# Patient Record
Sex: Male | Born: 1950 | Race: White | Hispanic: No | Marital: Married | Smoking: Former smoker
Health system: Southern US, Community
[De-identification: ages and names within clinical notes are randomized; demographics above are authoritative.]

---

## 2004-05-20 ENCOUNTER — Ambulatory Visit: Payer: Self-pay | Admitting: General Surgery

## 2005-08-17 ENCOUNTER — Other Ambulatory Visit: Payer: Self-pay

## 2005-08-26 ENCOUNTER — Ambulatory Visit: Payer: Self-pay | Admitting: Specialist

## 2007-08-23 IMAGING — CR DG CHEST 2V
1 series · 2 of 2 positions shown · non-contrast
Comparison: none

REASON FOR EXAM: Asthma
COMMENTS:

PROCEDURE:     DXR - DXR CHEST PA (OR AP) AND LATERAL  - August 17, 2005 [DATE]
RESULT:       The lung fields are clear.   The heart and pulmonary
vasculature are normal in appearance.  No acute bony abnormalities are seen.

[Series 4167: postero_anterior · 0.22mm/px · 2 of 2 slices shown]
[im 1/2]
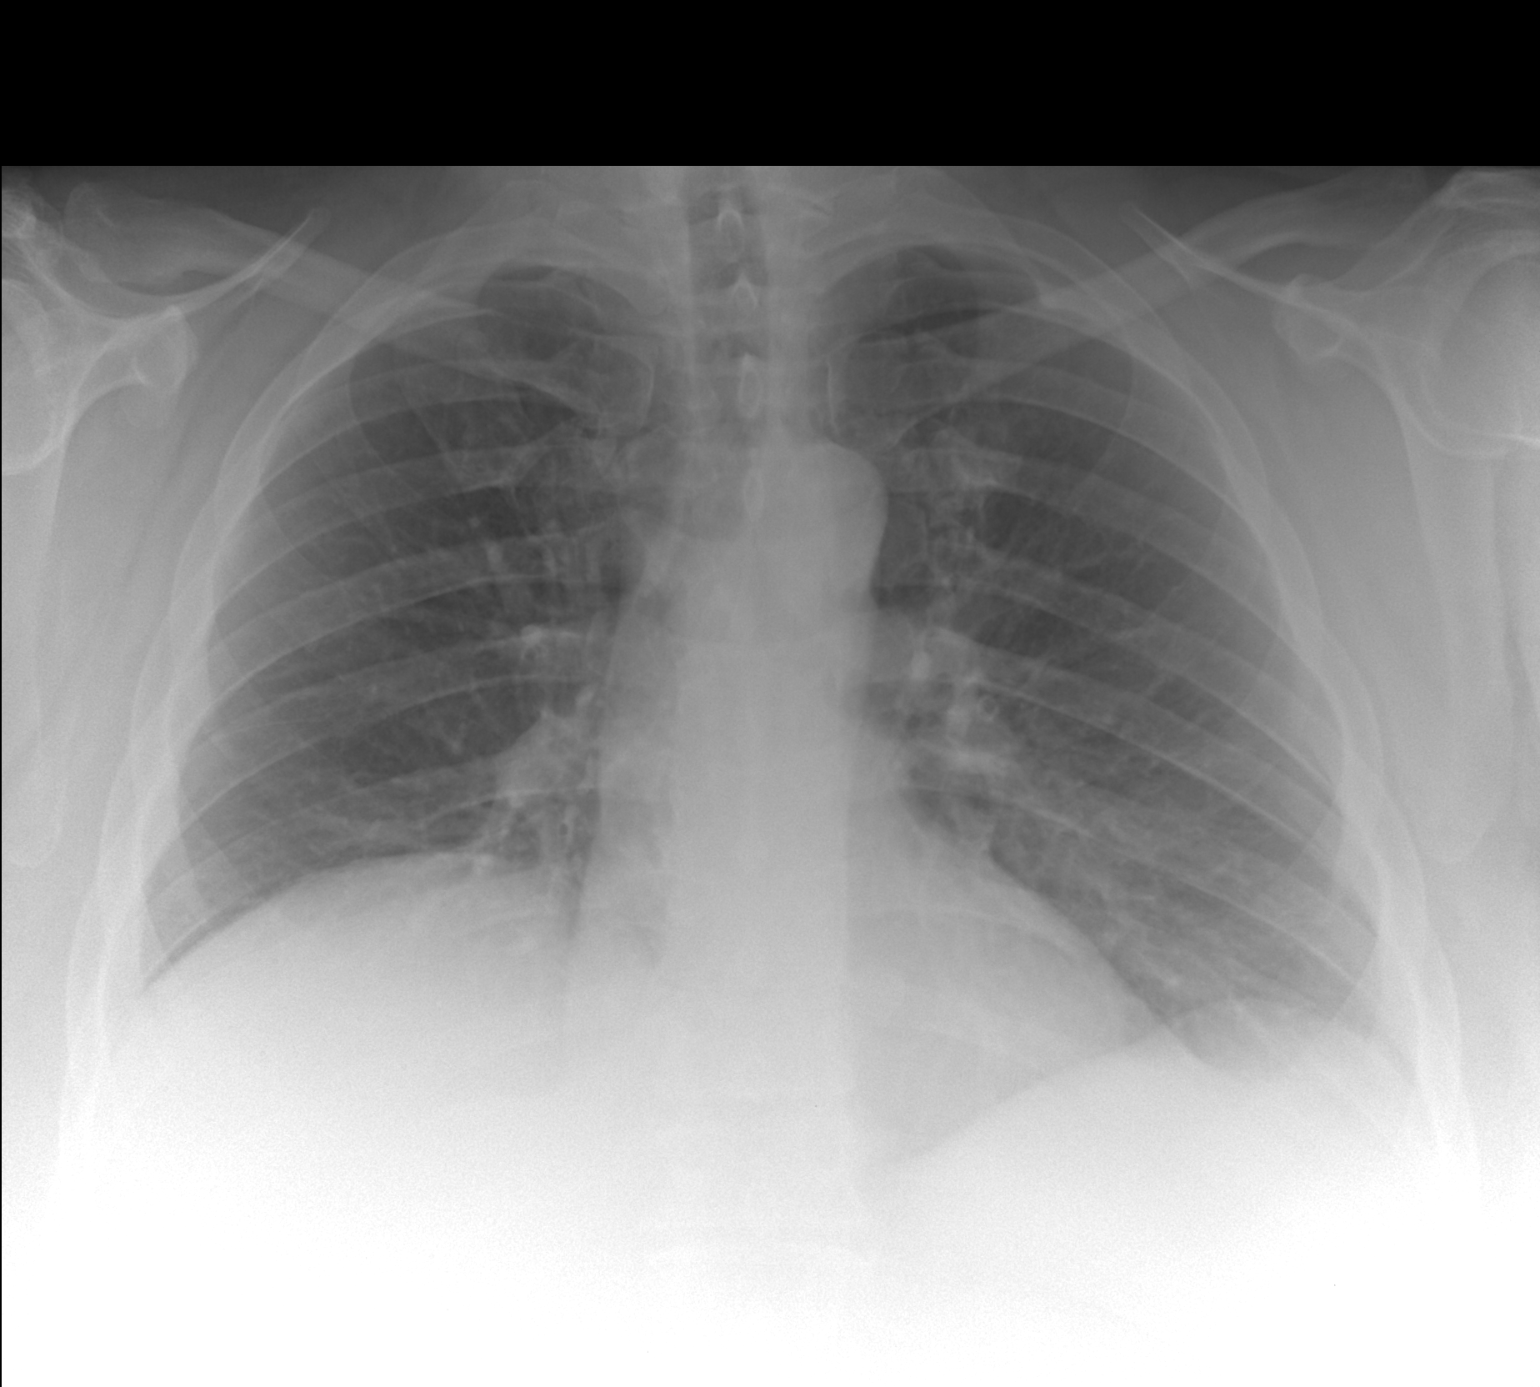
[im 2/2]
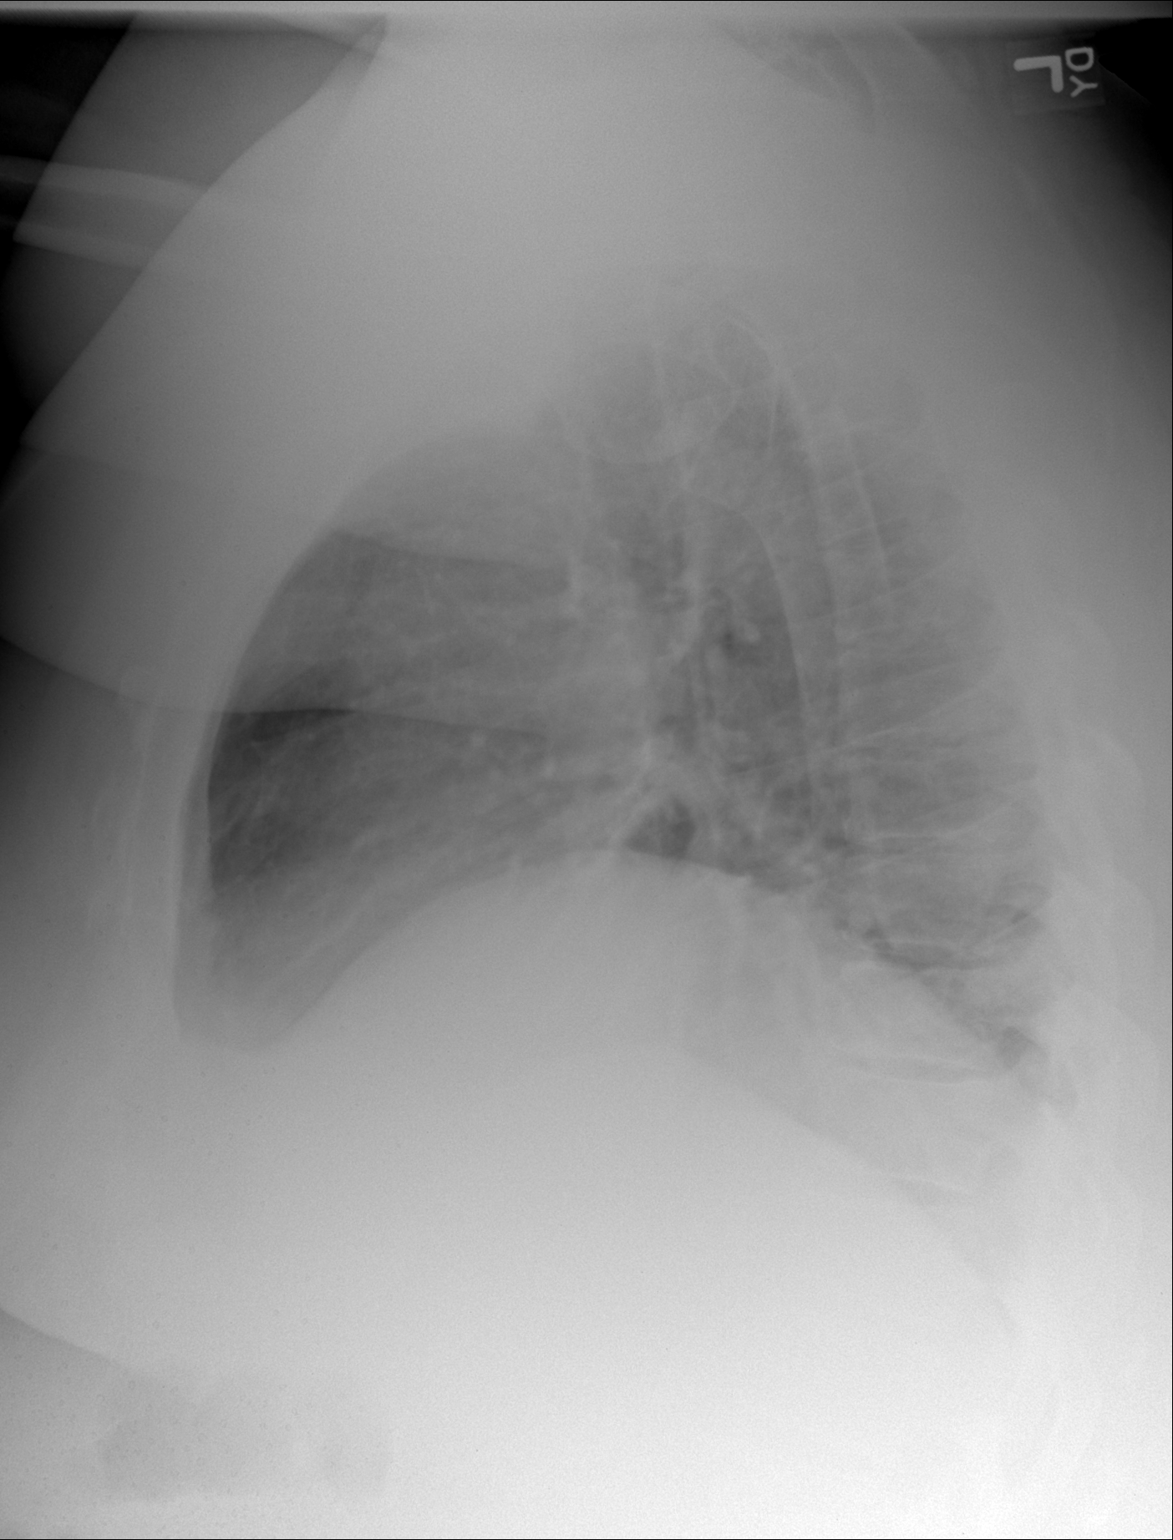

[2 of 2 positions shown; findings below may reference images not displayed]

IMPRESSION: No acute changes are identified.

## 2015-03-14 DIAGNOSIS — I890 Lymphedema, not elsewhere classified: Secondary | ICD-10-CM | POA: Insufficient documentation

## 2015-03-14 DIAGNOSIS — M1710 Unilateral primary osteoarthritis, unspecified knee: Secondary | ICD-10-CM | POA: Insufficient documentation

## 2015-03-14 DIAGNOSIS — I878 Other specified disorders of veins: Secondary | ICD-10-CM | POA: Insufficient documentation

## 2015-03-14 DIAGNOSIS — L97919 Non-pressure chronic ulcer of unspecified part of right lower leg with unspecified severity: Secondary | ICD-10-CM | POA: Insufficient documentation

## 2017-05-19 DIAGNOSIS — R600 Localized edema: Secondary | ICD-10-CM | POA: Insufficient documentation

## 2017-05-19 DIAGNOSIS — Q820 Hereditary lymphedema: Secondary | ICD-10-CM | POA: Insufficient documentation

## 2020-03-21 ENCOUNTER — Other Ambulatory Visit: Payer: Self-pay

## 2020-03-21 ENCOUNTER — Ambulatory Visit (INDEPENDENT_AMBULATORY_CARE_PROVIDER_SITE_OTHER): Payer: Medicare Other | Admitting: Vascular Surgery

## 2020-03-21 ENCOUNTER — Encounter (INDEPENDENT_AMBULATORY_CARE_PROVIDER_SITE_OTHER): Payer: Self-pay | Admitting: Vascular Surgery

## 2020-03-21 VITALS — BP 148/79 | HR 66 | Ht 69.0 in | Wt 356.0 lb

## 2020-03-21 DIAGNOSIS — L97919 Non-pressure chronic ulcer of unspecified part of right lower leg with unspecified severity: Secondary | ICD-10-CM | POA: Diagnosis not present

## 2020-03-21 DIAGNOSIS — I890 Lymphedema, not elsewhere classified: Secondary | ICD-10-CM

## 2020-03-21 DIAGNOSIS — I872 Venous insufficiency (chronic) (peripheral): Secondary | ICD-10-CM | POA: Insufficient documentation

## 2020-03-21 DIAGNOSIS — I83019 Varicose veins of right lower extremity with ulcer of unspecified site: Secondary | ICD-10-CM | POA: Diagnosis not present

## 2020-03-21 NOTE — Progress Notes (Signed)
MRN : [id number]  [name] is a 69 y.o. (1950/09/03) male who presents with chief complaint of  Chief Complaint  Patient presents with  . New Patient (Initial Visit)    Miller lyemphedema  .  History of Present Illness:   Patient is seen for evaluation of leg pain and swelling associated with ulceration of the right leg posteriorly. The patient first noticed the lymphedema and venous issues decades ago. The swelling is associated with pain and discoloration. The pain and swelling worsens with prolonged dependency and improves with elevation. The pain is unrelated to activity.  The patient notes that in the morning the legs are better but the leg symptoms worsened throughout the course of the day. The patient has also noted a progressive worsening of the discoloration in the ankle and shin area.   The patient notes that he has had an ulcer on and off for years.  There is a moderate amount of drainage associated with the open area.    The patient denies claudication symptoms or rest pain symptoms.  The patient denies DJD and LS spine disease.  The patient has not had any past angiography, interventions or vascular surgery.  Elevation makes the leg symptoms better, dependency makes them much worse. The patient denies any recent changes in medications.  The patient has been wearing graduated compression for a long time but he states his legs got so bad they no longer fit and so now his wife is wrapping his legs daily with what sounds like a modified Unna boot.  He also states he has had a lymph pump for perhaps 7 years but is no longer using it because the sleeves do not stay on particularly around the knee when he turns the machine on.  The patient denies a history of DVT or PE. There is no prior history of phlebitis. There is no history of primary lymphedema.  No history of malignancies. No history of trauma or groin or pelvic surgery. There is no history of radiation treatment  to the groin or pelvis       Current Meds  Medication Sig  . arformoterol (BROVANA) 15 MCG/2ML NEBU Inhale into the lungs.  Marland Kitchen aspirin 81 MG EC tablet Take by mouth.  . Calcium 500 MG tablet Take by mouth.  . Cholecalciferol (VITAMIN D3) 1.25 MG (50000 UT) CAPS Take by mouth.  . Cobalamin Combinations (B-12) [phone] MCG SUBL Place under the tongue.  Marland Kitchen ibuprofen (ADVIL) 800 MG tablet   . Multiple Vitamins-Minerals (MULTIVITAMIN ADULT, MINERALS, PO) Take 1 tablet by mouth daily.  Marland Kitchen omeprazole (PRILOSEC) 20 MG capsule Take by mouth.  . STIOLTO RESPIMAT 2.5-2.5 MCG/ACT AERS   . TAMSULOSIN HCL PO Take by mouth.    No past medical history on file.    Social History Social History   Tobacco Use  . Smoking status: Former Smoker    Quit date: 03/22/1987    Years since quitting: 33.0  . Smokeless tobacco: Never Used  Substance Use Topics  . Alcohol use: Not on file  . Drug use: Not on file    Family History No family history of bleeding/clotting disorders, porphyria or autoimmune disease   Allergies  Allergen Reactions  . Celecoxib Other (See Comments)    Made him mean and nasty      REVIEW OF SYSTEMS (Negative unless checked)  Constitutional: [] Weight loss  [] Fever  [] Chills Cardiac: [] Chest pain   [] Chest pressure   [] Palpitations   [] Shortness  of breath when laying flat   [] Shortness of breath with exertion. Vascular:  [] Pain in legs with walking   [x] Pain in legs at rest  [] History of DVT   [] Phlebitis   [x] Swelling in legs   [] Varicose veins   [x] Non-healing ulcers Pulmonary:   [] Uses home oxygen   [] Productive cough   [] Hemoptysis   [] Wheeze  [] COPD   [] Asthma Neurologic:  [] Dizziness   [] Seizures   [] History of stroke   [] History of TIA  [] Aphasia   [] Vissual changes   [] Weakness or numbness in arm   [] Weakness or numbness in leg Musculoskeletal:   [] Joint swelling   [] Joint pain   [] Low back pain Hematologic:  [] Easy bruising  [] Easy bleeding   [] Hypercoagulable  state   [] Anemic Gastrointestinal:  [] Diarrhea   [] Vomiting  [] Gastroesophageal reflux/heartburn   [] Difficulty swallowing. Genitourinary:  [] Chronic kidney disease   [] Difficult urination  [] Frequent urination   [] Blood in urine Skin:  [x] Rashes   [x] Ulcers  Psychological:  [] History of anxiety   []  History of major depression.  Physical Examination  Vitals:   03/21/20 1027  BP: (!) 148/79  Pulse: 66  Weight: (!) 356 lb (161.5 kg)  Height: 5\' 9"  (1.753 m)   Body mass index is 52.57 kg/m. Gen: WD/WN, NAD Head: [place]/AT, No temporalis wasting.  Ear/Nose/Throat: Hearing grossly intact, nares w/o erythema or drainage, poor dentition Eyes: PER, EOMI, sclera nonicteric.  Neck: Supple, no masses.  No bruit or JVD.  Pulmonary:  Good air movement, clear to auscultation bilaterally, no use of accessory muscles.  Cardiac: RRR, normal S1, S2, no Murmurs. Vascular: scattered varicosities present bilaterally.  Profound venous stasis changes to the legs bilaterally.  4+ massive hard non-pitting edema.  Ulcer posterior calf draining yellow lymph fluid Vessel Right Left  Radial Palpable Palpable  Gastrointestinal: soft, non-distended. No guarding/no peritoneal signs.  Musculoskeletal: M/S 5/5 throughout.  No deformity or atrophy.  Neurologic: CN 2-12 intact. Pain and light touch intact in extremities.  Symmetrical.  Speech is fluent. Motor exam as listed above. Psychiatric: Judgment intact, Mood & affect appropriate for pt's clinical situation. Dermatologic: Profound venous stasis changes to the legs bilaterally.  4+ massive hard non-pitting edema.  Ulcer posterior calf draining yellow lymph fluid.  No changes consistent with cellulitis. Lymph : + lichenification and skin changes of chronic lymphedema.  CBC No results found for: WBC, HGB, HCT, MCV, PLT  BMET No results found for: NA, K, CL, CO2, GLUCOSE, BUN, CREATININE, CALCIUM, GFRNONAA, GFRAA CrCl cannot be calculated (No successful lab value  found.).  COAG No results found for: INR, PROTIME  Radiology No results found.    Assessment/Plan 1. Venous stasis ulcer of right lower extremity (HCC) No surgery or intervention at this point in time.    I have had a long discussion with the patient regarding venous insufficiency and why it  causes symptoms. I have discussed with the patient the chronic skin changes that accompany venous insufficiency and the long term sequela such as infection and ulceration.  Patient will begin wearing graduated compression  wraps on a daily basis a prescription was given. The patient will put the stockings on first thing in the morning and removing them in the evening. The patient is instructed specifically not to sleep in the wraps.    In addition, behavioral modification including several periods of elevation of the lower extremities during the day will be continued. I have demonstrated that proper elevation is a position with the ankles at  heart level.  The patient is instructed to begin routine exercise, especially walking on a daily basis  Also it sounds like his pump is old and not functioning properly and we will work toward getting him a new updated pump that is properly fitted   2. Chronic venous insufficiency See #1  3. Lymphedema of both lower extremities Recommend:  No surgery or intervention at this point in time.    I have reviewed my previous discussion with the patient regarding swelling and why it causes symptoms.  Patient will continue wearing graduated compression stockings class 1 (20-30 mmHg) on a daily basis. The patient will  beginning wearing the stockings first thing in the morning and removing them in the evening. The patient is instructed specifically not to sleep in the stockings.    In addition, behavioral modification including several periods of elevation of the lower extremities during the day will be continued.  This was reviewed with the patient during the  initial visit.  The patient will also continue routine exercise, especially walking on a daily basis as was discussed during the initial visit.    Despite conservative treatments including graduated compression therapy class 1 and behavioral modification including exercise and elevation the patient  has not obtained adequate control of the lymphedema.  The patient still has stage 3 lymphedema and therefore, I believe that a lymph pump should be added to improve the control of the patient's lymphedema.  Additionally, a lymph pump is warranted because it will reduce the risk of cellulitis and ulceration in the future.  Patient should follow-up in six months      Levora Dredge, MD  03/21/2020 11:12 AM

## 2020-09-19 ENCOUNTER — Ambulatory Visit (INDEPENDENT_AMBULATORY_CARE_PROVIDER_SITE_OTHER): Payer: Medicare Other | Admitting: Nurse Practitioner

## 2021-11-14 ENCOUNTER — Encounter: Payer: Medicare Other | Attending: Physician Assistant | Admitting: Physician Assistant

## 2021-11-14 DIAGNOSIS — I87331 Chronic venous hypertension (idiopathic) with ulcer and inflammation of right lower extremity: Secondary | ICD-10-CM | POA: Insufficient documentation

## 2021-11-14 DIAGNOSIS — J449 Chronic obstructive pulmonary disease, unspecified: Secondary | ICD-10-CM | POA: Insufficient documentation

## 2021-11-14 DIAGNOSIS — L97812 Non-pressure chronic ulcer of other part of right lower leg with fat layer exposed: Secondary | ICD-10-CM | POA: Insufficient documentation

## 2021-11-14 DIAGNOSIS — I10 Essential (primary) hypertension: Secondary | ICD-10-CM | POA: Insufficient documentation

## 2021-11-14 DIAGNOSIS — I890 Lymphedema, not elsewhere classified: Secondary | ICD-10-CM | POA: Insufficient documentation

## 2021-11-14 NOTE — Progress Notes (Signed)
EUGENIO, DOLLINS ([id number]) Visit Report for 11/14/2021 Allergy List Details Patient Name: [name], [name]. Date of Service: 11/14/2021 8:45 AM Medical Record Number: [id number] Patient Account Number: [id number] Date of Birth/Sex: 17-Apr-1951 (71 y.o. M) Treating RN: Carlene Coria Primary Care Kimanh Templeman: Carley Hammed Other Clinician: Referring Justyce Yeater: Referral, Self Treating Laquia Rosano/Extender: Skipper Cliche in Treatment: 0 Allergies Active Allergies Sulfa (Sulfonamide Antibiotics) Celebrex Septra Allergy Notes Electronic Signature(s) Signed: 11/14/2021 4:26:56 PM By: Carlene Coria RN Entered By: Carlene Coria on 11/14/2021 08:49:27 Wholey, Elliot Dally ([id number]) -------------------------------------------------------------------------------- [place] Details Patient Name: [name] Date of Service: 11/14/2021 8:45 AM Medical Record Number: [id number] Patient Account Number: [id number] Date of Birth/Sex: 05/03/1951 (71 y.o. M) Treating RN: Carlene Coria Primary Care Emilea Goga: Carley Hammed Other Clinician: Referring Hardin Hardenbrook: Referral, Self Treating Allisson Schindel/Extender: Skipper Cliche in Treatment: 0 Visit Information Patient Arrived: Ambulatory Arrival Time: 08:47 Accompanied By: self Transfer Assistance: None Patient Identification Verified: Yes Secondary Verification Process Completed: Yes Patient Requires Transmission-Based Precautions: No Patient Has Alerts: No Electronic Signature(s) Signed: 11/14/2021 4:26:56 PM By: Carlene Coria RN Entered By: Carlene Coria on 11/14/2021 08:47:47 Huston, Elliot Dally ([id number]) -------------------------------------------------------------------------------- Clinic Level of Care Assessment Details Patient Name: [name] Date of Service: 11/14/2021 8:45 AM Medical Record Number: [id number] Patient Account Number: [id number] Date of Birth/Sex: 07-31-1950 (71 y.o. M) Treating RN: Carlene Coria Primary Care Deaveon Schoen: Carley Hammed Other Clinician: Referring Jessalynn Mccowan: Referral, Self Treating Kialee Kham/Extender: Skipper Cliche in Treatment: 0 Clinic Level of Care Assessment Items TOOL 1 Quantity Score X - Use when EandM and Procedure is performed on INITIAL visit 1 0 ASSESSMENTS - Nursing Assessment / Reassessment X - General Physical Exam (combine w/ comprehensive assessment (listed just below) when performed on new 1 20 pt. evals) X- 1 25 Comprehensive Assessment (HX, ROS, Risk Assessments, Wounds Hx, etc.) ASSESSMENTS - Wound and Skin Assessment / Reassessment []  - Dermatologic / Skin Assessment (not related to wound area) 0 ASSESSMENTS - Ostomy and/or Continence Assessment and Care []  - Incontinence Assessment and Management 0 []  - 0 Ostomy Care Assessment and Management (repouching, etc.) PROCESS - Coordination of Care X - Simple Patient / Family Education for ongoing care 1 15 []  - 0 Complex (extensive) Patient / Family Education for ongoing care X- 1 10 Staff obtains Programmer, systems, Records, Test Results / Process Orders []  - 0 Staff telephones HHA, Nursing Homes / Clarify orders / etc []  - 0 Routine Transfer to another Facility (non-emergent condition) []  - 0 Routine Hospital Admission (non-emergent condition) X- 1 15 New Admissions / Biomedical engineer / Ordering NPWT, Apligraf, etc. []  - 0 Emergency Hospital Admission (emergent condition) PROCESS - Special Needs []  - Pediatric / Minor Patient Management 0 []  - 0 Isolation Patient Management []  - 0 Hearing / Language / Visual special needs []  - 0 Assessment of Community assistance (transportation, D/C planning, etc.) []  - 0 Additional assistance / Altered mentation []  - 0 Support Surface(s) Assessment (bed, cushion, seat, etc.) INTERVENTIONS - Miscellaneous []  - External ear exam 0 []  - 0 Patient Transfer (multiple staff / Civil Service fast streamer / Similar devices) []  - 0 Simple Staple / Suture  removal (25 or less) []  - 0 Complex Staple / Suture removal (26 or more) []  - 0 Hypo/Hyperglycemic Management (do not check if billed separately) X- 1 15 Ankle / Brachial Index (ABI) - do not check if billed separately Has the patient been seen at the hospital within the last three years: Yes Total Score: 100 Level  Of Care: New/Established - Level 3 DEWIE, AHART ([id number]) Electronic Signature(s) Signed: 11/14/2021 4:26:56 PM By: Carlene Coria RN Entered By: Carlene Coria on 11/14/2021 09:44:49 Fina, Elliot Dally ([id number]) -------------------------------------------------------------------------------- Compression Therapy Details Patient Name: [name] Date of Service: 11/14/2021 8:45 AM Medical Record Number: [id number] Patient Account Number: [id number] Date of Birth/Sex: September 10, 1950 (72 y.o. M) Treating RN: Carlene Coria Primary Care Karita Dralle: Carley Hammed Other Clinician: Referring Winslow Verrill: Referral, Self Treating Ramesh Moan/Extender: Skipper Cliche in Treatment: 0 Compression Therapy Performed for Wound Assessment: Wound #1 Right Lower Leg Performed By: Clinician Carlene Coria, RN Compression Type: Three Layer Post Procedure Diagnosis Same as Pre-procedure Electronic Signature(s) Signed: 11/14/2021 4:26:56 PM By: Carlene Coria RN Entered By: Carlene Coria on 11/14/2021 09:44:06 Vonstein, Elliot Dally ([id number]) -------------------------------------------------------------------------------- Lower Extremity Assessment Details Patient Name: [name] Date of Service: 11/14/2021 8:45 AM Medical Record Number: [id number] Patient Account Number: [id number] Date of Birth/Sex: 04/01/1951 (71 y.o. M) Treating RN: Carlene Coria Primary Care Samah Lapiana: Carley Hammed Other Clinician: Referring Bowie Doiron: Referral, Self Treating Issabela Lesko/Extender: Skipper Cliche in Treatment: 0 Edema Assessment Assessed: [Left: No] [Right: No] Edema: [Left: Ye]  [Right: s] Calf Left: Right: Point of Measurement: 37 cm From Medial Instep 59 cm Ankle Left: Right: Point of Measurement: 13 cm From Medial Instep 35 cm Knee To Floor Left: Right: From Medial Instep 50 cm Vascular Assessment Pulses: Dorsalis Pedis Palpable: [Right:Yes] Doppler Audible: [Right:Yes] Blood Pressure: Brachial: [Right:130] Ankle: [Right:Dorsalis Pedis: 160 1.23] Electronic Signature(s) Signed: 11/14/2021 4:26:56 PM By: Carlene Coria RN Entered By: Carlene Coria on 11/14/2021 09:19:22 Ransier, Elliot Dally ([id number]) -------------------------------------------------------------------------------- Multi Wound Chart Details Patient Name: [name] Date of Service: 11/14/2021 8:45 AM Medical Record Number: [id number] Patient Account Number: [id number] Date of Birth/Sex: 20-Oct-1950 (71 y.o. M) Treating RN: Carlene Coria Primary Care Keara Pagliarulo: Carley Hammed Other Clinician: Referring Demetrious Rainford: Referral, Self Treating [place] Decoteau/Extender: Skipper Cliche in Treatment: 0 Vital Signs Height(in): 68 Pulse(bpm): 71 Weight(lbs): 340 Blood Pressure(mmHg): 130/85 Body Mass Index(BMI): 51.7 Temperature(F): 98 Respiratory Rate(breaths/min): 18 Photos: [1:No Photos] [N/A:N/A] Wound Location: [1:Right Lower Leg] [N/A:N/A] Wounding Event: [1:Bite] [N/A:N/A] Primary Etiology: [1:Venous Leg Ulcer] [N/A:N/A] Comorbid History: [1:Asthma, Chronic Obstructive Pulmonary Disease (COPD), Hypertension] [N/A:N/A] Date Acquired: [1:12/15/1991] [N/A:N/A] Weeks of Treatment: [1:0] [N/A:N/A] Wound Status: [1:Open] [N/A:N/A] Wound Recurrence: [1:No] [N/A:N/A] Measurements L x W x D (cm) [1:5x3.5x0.1] [N/A:N/A] Area (cm) : [1:13.744] [N/A:N/A] Volume (cm) : [1:1.374] [N/A:N/A] Classification: [1:Full Thickness Without Exposed Support Structures] [N/A:N/A] Exudate Amount: [1:Medium] [N/A:N/A] Exudate Type: [1:Serosanguineous] [N/A:N/A] Exudate Color: [1:red, brown]  [N/A:N/A] Granulation Amount: [1:Medium (34-66%)] [N/A:N/A] Granulation Quality: [1:Red] [N/A:N/A] Necrotic Amount: [1:Medium (34-66%)] [N/A:N/A] Exposed Structures: [1:Fat Layer (Subcutaneous Tissue): Yes Fascia: No Tendon: No Muscle: No Joint: No Bone: No None] [N/A:N/A N/A] Treatment Notes Electronic Signature(s) Signed: 11/14/2021 4:26:56 PM By: Carlene Coria RN Entered By: Carlene Coria on 11/14/2021 09:39:37 Evinger, Elliot Dally ([id number]) -------------------------------------------------------------------------------- [place] Details Patient Name: [name] Date of Service: 11/14/2021 8:45 AM Medical Record Number: [id number] Patient Account Number: [id number] Date of Birth/Sex: 1950/06/18 (71 y.o. M) Treating RN: Carlene Coria Primary Care Collie Kittel: Carley Hammed Other Clinician: Referring Imad Shostak: Referral, Self Treating Jerene Yeager/Extender: Skipper Cliche in Treatment: 0 Active Inactive Wound/Skin Impairment Nursing Diagnoses: Knowledge deficit related to ulceration/compromised skin integrity Goals: Patient/caregiver will verbalize understanding of skin care regimen Date Initiated: 11/14/2021 Target Resolution Date: 12/14/2021 Goal Status: Active Ulcer/skin breakdown will have a volume reduction of 30% by week 4 Date Initiated: 11/14/2021 Target Resolution Date: 12/14/2021 Goal  Status: Active Ulcer/skin breakdown will have a volume reduction of 50% by week 8 Date Initiated: 11/14/2021 Target Resolution Date: 01/14/2022 Goal Status: Active Ulcer/skin breakdown will have a volume reduction of 80% by week 12 Date Initiated: 11/14/2021 Target Resolution Date: 02/14/2022 Goal Status: Active Ulcer/skin breakdown will heal within 14 weeks Date Initiated: 11/14/2021 Target Resolution Date: 03/16/2022 Goal Status: Active Interventions: Assess patient/caregiver ability to obtain necessary supplies Assess patient/caregiver ability to perform ulcer/skin care  regimen upon admission and as needed Assess ulceration(s) every visit Notes: Electronic Signature(s) Signed: 11/14/2021 4:26:56 PM By: Carlene Coria RN Entered By: Carlene Coria on 11/14/2021 09:39:15 Speigner, Elliot Dally ([id number]) -------------------------------------------------------------------------------- Pain Assessment Details Patient Name: [name] Date of Service: 11/14/2021 8:45 AM Medical Record Number: [id number] Patient Account Number: [id number] Date of Birth/Sex: 1950/10/18 (71 y.o. M) Treating RN: Carlene Coria Primary Care Jewelia Bocchino: Carley Hammed Other Clinician: Referring Jhonny Calixto: Referral, Self Treating Patrich Heinze/Extender: Skipper Cliche in Treatment: 0 Active Problems Location of Pain Severity and Description of Pain Patient Has Paino No Site Locations Pain Management and Medication Current Pain Management: Electronic Signature(s) Signed: 11/14/2021 4:26:56 PM By: Carlene Coria RN Entered By: Carlene Coria on 11/14/2021 08:48:05 Saenz, Elliot Dally ([id number]) -------------------------------------------------------------------------------- Patient/Caregiver Education Details Patient Name: [name] Date of Service: 11/14/2021 8:45 AM Medical Record Number: [id number] Patient Account Number: [id number] Date of Birth/Gender: 1950-10-03 (71 y.o. M) Treating RN: Carlene Coria Primary Care Physician: Carley Hammed Other Clinician: Referring Physician: Referral, Self Treating Physician/Extender: Skipper Cliche in Treatment: 0 Education Assessment Education Provided To: Patient Education Topics Provided Wound/Skin Impairment: Methods: Explain/Verbal Responses: State content correctly Electronic Signature(s) Signed: 11/14/2021 4:26:56 PM By: Carlene Coria RN Entered By: Carlene Coria on 11/14/2021 09:45:11 Trahan, Elliot Dally ([id number]) -------------------------------------------------------------------------------- Wound Assessment  Details Patient Name: [name] Date of Service: 11/14/2021 8:45 AM Medical Record Number: [id number] Patient Account Number: [id number] Date of Birth/Sex: 05-31-1951 (71 y.o. M) Treating RN: Carlene Coria Primary Care Rashun Grattan: Carley Hammed Other Clinician: Referring Galo Sayed: Referral, Self Treating Terrill Alperin/Extender: Skipper Cliche in Treatment: 0 Wound Status Wound Number: 1 Primary Venous Leg Ulcer Etiology: Wound Location: Right Lower Leg Wound Status: Open Wounding Event: Bite Comorbid Asthma, Chronic Obstructive Pulmonary Disease Date Acquired: 12/15/1991 History: (COPD), Hypertension Weeks Of Treatment: 0 Clustered Wound: No Wound Measurements Length: (cm) 5 Width: (cm) 3.5 Depth: (cm) 0.1 Area: (cm) 13.744 Volume: (cm) 1.374 % Reduction in Area: % Reduction in Volume: Epithelialization: None Tunneling: No Undermining: No Wound Description Classification: Full Thickness Without Exposed Support Structu Exudate Amount: Medium Exudate Type: Serosanguineous Exudate Color: red, brown res Foul Odor After Cleansing: No Slough/Fibrino Yes Wound Bed Granulation Amount: Medium (34-66%) Exposed Structure Granulation Quality: Red Fascia Exposed: No Necrotic Amount: Medium (34-66%) Fat Layer (Subcutaneous Tissue) Exposed: Yes Necrotic Quality: Adherent Slough Tendon Exposed: No Muscle Exposed: No Joint Exposed: No Bone Exposed: No Treatment Notes Wound #1 (Lower Leg) Wound Laterality: Right Cleanser [place] Ancillary Kit - 15 Day Supply Discharge Instruction: Use supplies as instructed; Kit contains: (15) Saline Bullets; (15) 3x3 Gauze; 15 pr Gloves Soap and Water Discharge Instruction: Gently cleanse wound with antibacterial soap, rinse and pat dry prior to dressing wounds Peri-Wound Care Topical Primary Dressing Silvercel Small 2x2 (in/in) Discharge Instruction: Apply Silvercel Small 2x2 (in/in) as instructed Secondary Dressing ABD Pad 5x9  (in/in) Discharge Instruction: Cover with ABD pad Gauze Discharge Instruction: As directed: dry, moistened with saline or moistened with Dakins Solution Altic, Elliot Dally ([id number]) Secured With Compression Wrap 3-LAYER WRAP -  Profore Lite LF 3 Multilayer Compression Bandaging System Discharge Instruction: Apply 3 multi-layer wrap as prescribed. Compression Stockings Add-Ons Electronic Signature(s) Signed: 11/14/2021 4:26:56 PM By: Carlene Coria RN Entered By: Carlene Coria on 11/14/2021 09:21:59 [place], Elliot Dally ([id number]) -------------------------------------------------------------------------------- [place] Details Patient Name: [name] Date of Service: 11/14/2021 8:45 AM Medical Record Number: [id number] Patient Account Number: [id number] Date of Birth/Sex: Oct 08, 1950 (71 y.o. M) Treating RN: Carlene Coria Primary Care Rodgerick Gilliand: Carley Hammed Other Clinician: Referring Alan Drummer: Referral, Self Treating Tyrique Sporn/Extender: Skipper Cliche in Treatment: 0 Vital Signs Time Taken: 08:48 Temperature (F): 98 Height (in): 68 Pulse (bpm): 71 Source: Stated Respiratory Rate (breaths/min): 18 Weight (lbs): 340 Blood Pressure (mmHg): 130/85 Source: Stated Reference Range: 80 - 120 mg / dl Body Mass Index (BMI): 51.7 Electronic Signature(s) Signed: 11/14/2021 4:26:56 PM By: Carlene Coria RN Entered By: Carlene Coria on 11/14/2021 08:48:30

## 2021-11-14 NOTE — Progress Notes (Signed)
OBRIEN, HUSKINS ([id number]) Visit Report for 11/14/2021 Chief Complaint Document Details Patient Name: [name], [name]. Date of Service: 11/14/2021 8:45 AM Medical Record Number: [id number] Patient Account Number: [id number] Date of Birth/Sex: Oct 26, 1950 (71 y.o. 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 M) Treating RN: Carlene Coria Primary Care Provider: Carley Hammed Other Clinician: Referring Provider: Referral, Self Treating Provider/Extender: Skipper Cliche in Treatment: 0 Debridement Performed for Wound #1 Right Lower Leg Assessment: Performed By: Physician Tommie Sams., PA-C Debridement Type: Debridement Severity of Tissue Pre Debridement: Fat layer exposed Level of Consciousness (Pre- Awake and Alert procedure): Pre-procedure Verification/Time Out Yes - 09:47 Taken: Start Time: 09:47 Total Area Debrided (L x W): 5 (cm) x 3.5 (cm) = 17.5 (cm) Tissue and other material Viable, Non-Viable, Slough, Subcutaneous, Skin: Dermis , Skin: Epidermis, Biofilm, Slough debrided: Level: Skin/Subcutaneous Tissue Debridement Description: Excisional Instrument: Curette Bleeding: Moderate Hemostasis Achieved: Pressure End Time: 09:51 Procedural Pain: 0 Post Procedural Pain: 0 Response to Treatment:  Procedure was tolerated well Level of Consciousness (Post- Awake and Alert procedure): Post Debridement Measurements of Total Wound Length: (cm) 5 Width: (cm) 3.5 Depth: (cm) 0.3 Volume: (cm) 4.123 Character of Wound/Ulcer Post Debridement: Improved Severity of Tissue Post Debridement: Fat layer exposed Post Procedure Diagnosis Same as Pre-procedure Electronic Signature(s) Signed: 11/14/2021 4:26:56 PM By: Carlene Coria RN Signed: 11/14/2021 4:36:57 PM By: Worthy Keeler PA-C Entered By: Carlene Coria on 11/14/2021 09:49:51 [place], Elliot Dally ([id number]) -------------------------------------------------------------------------------- HPI Details Patient Name: [name] Date of Service: 11/14/2021 8:45 AM Medical Record Number: [id number] Patient Account Number: [id number] Date of Birth/Sex: 1950-07-25 (71 y.o. M) Treating RN: Carlene Coria Primary Care Provider: Carley Hammed Other Clinician: Referring Provider: Referral, Self Treating Provider/Extender: Skipper Cliche in Treatment: 0 History of Present Illness HPI Description: 11-14-2021 upon evaluation today patient presents for initial evaluation here in our clinic concerning issues he is having with his right lower extremity he does have a wound on the posterior aspect. This has been present intermittently honestly since 1993. 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 There is a lot of scar tissue which I think is probably the reason why this reopens specifically in regard to the fact that he does have chronic venous insufficiency and lymphedema in his extremities. This means anytime his legs swell this is good to be the most likely area to weep. Right now its been about 3 weeks they have been trying to get this closed at this time and to be honest generally it closes much more quickly than  that. That is the reason he has been seen today to see if we can get this closed. Has been on 3 rounds of antibiotics without benefit. On 10-01-2021 the patient did have stents placed bilaterally in the lower extremities and ablation on the left side done. This is outside of our visible EMR system I was  not able to confirm this independently but his arterial flow and ABIs in the clinic today was 1.33 on the right. He also was started on Augmentin on May 15 for 10-day course. That is done at this point. Otherwise he has a history of hypertension and COPD. Electronic Signature(s) Signed: 11/14/2021 1:26:01 PM By: Worthy Keeler PA-C Entered By: Worthy Keeler on 11/14/2021 13:26:01 [place], Elliot Dally ([id number]) -------------------------------------------------------------------------------- Physical Exam Details Patient Name: [name] Date of Service: 11/14/2021 8:45 AM Medical Record Number: [id number] Patient Account Number: [id number] Date of Birth/Sex: 02/18/1951 (71 y.o. M) Treating RN: Carlene Coria Primary Care Provider: Carley Hammed Other Clinician: Referring Provider: Referral, Self Treating Provider/Extender: Skipper Cliche in Treatment: 0 Constitutional sitting or standing blood pressure is within target range for patient.. pulse regular and within target range for patient.Marland Kitchen respirations regular, non- labored and within target range for patient.Marland Kitchen temperature within target range for patient.. Obese and well-hydrated in no acute distress. Eyes conjunctiva clear no eyelid edema noted. pupils equal round and reactive to light and accommodation. Ears, Nose, Mouth, and Throat no gross abnormality of ear auricles or external auditory canals. normal hearing noted during conversation. mucus membranes moist. Respiratory normal breathing without difficulty. Cardiovascular 2+ dorsalis pedis/posterior tibialis pulses. no clubbing, cyanosis, significant edema, <3 sec cap  refill. Musculoskeletal normal gait and posture. no significant deformity or arthritic changes, no loss or range of motion, no clubbing. Psychiatric this patient is able to make decisions and demonstrates good insight into disease process. Alert and Oriented x 3. pleasant and cooperative. Notes Upon inspection patient's wound bed actually showed signs of good granulation and minimal areas he does have a lot of slough and biofilm buildup noted at this time. I do believe we can need to see what we can do about getting this under control. He voiced understanding. Subsequently we agreed to perform sharp debridement I did debride the wound today to clearway slough and biofilm there was a significant portion of this that I was able to remove and postdebridement the wound bed appears to be doing much better. Electronic Signature(s) Signed: 11/14/2021 1:32:03 PM By: Worthy Keeler PA-C Entered By: Worthy Keeler on 11/14/2021 13:32:03 [place], Elliot Dally ([id number]) -------------------------------------------------------------------------------- Physician Orders Details Patient Name: [name] Date of Service: 11/14/2021 8:45 AM Medical Record Number: [id number] Patient Account Number: [id number] Date of Birth/Sex: Jun 12, 1951 (71 y.o. M) Treating RN: Carlene Coria Primary Care Provider: Carley Hammed Other Clinician: Referring Provider: Referral, Self Treating Provider/Extender: Skipper Cliche in Treatment: 0 Verbal / Phone Orders: No Diagnosis Coding ICD-10 Coding Code Description I87.331 Chronic venous hypertension (idiopathic) with ulcer and inflammation of right lower extremity I89.0 Lymphedema, not elsewhere classified L97.812 Non-pressure chronic ulcer of other part of right lower leg with fat layer exposed I10 Essential (primary) hypertension J44.9 Chronic obstructive pulmonary disease, unspecified Follow-up Appointments o Return Appointment in 1 week. Bathing/  Shower/ Hygiene o May shower with wound dressing protected with water repellent cover or cast protector. Edema Control - Lymphedema / Segmental Compressive Device / Other o Elevate, Exercise Daily and Avoid Standing for Long Periods of Time. o Elevate legs to the level of the heart and pump ankles as often as possible o Elevate leg(s) parallel to the floor when sitting. Wound Treatment Wound #1 - Lower Leg Wound Laterality: Right Cleanser: Byram Ancillary Kit - 15 Day Supply 1 x Per Week/30 Days Discharge Instructions: Use supplies as instructed; Kit contains: (15) Saline Bullets; (15) 3x3 Gauze;  15 pr Gloves Cleanser: Soap and Water 1 x Per Week/30 Days Discharge Instructions: Gently cleanse wound with antibacterial soap, rinse and pat dry prior to dressing wounds Primary Dressing: Silvercel Small 2x2 (in/in) 1 x Per Week/30 Days Discharge Instructions: Apply Silvercel Small 2x2 (in/in) as instructed Secondary Dressing: ABD Pad 5x9 (in/in) 1 x Per Week/30 Days Discharge Instructions: Cover with ABD pad Secondary Dressing: Gauze 1 x Per Week/30 Days Discharge Instructions: As directed: dry, moistened with saline or moistened with Dakins Solution Compression Wrap: Medichoice 4 layer Compression System, 35-40 mmHG 1 x Per Week/30 Days Discharge Instructions: Apply multi-layer wrap as directed. Services and Therapies o Lymphedema Clinic - (ICD10 I89.0 - Lymphedema, not elsewhere classified) Electronic Signature(s) Signed: 11/14/2021 4:26:56 PM By: Carlene Coria RN Signed: 11/14/2021 4:36:57 PM By: Worthy Keeler PA-C Entered By: Carlene Coria on 11/14/2021 09:52:39 [place], Elliot Dally ([id number]) -------------------------------------------------------------------------------- Problem List Details Patient Name: [name] Date of Service: 11/14/2021 8:45 AM Medical Record Number: [id number] Patient Account Number: [id number] Date of Birth/Sex: [birth year] (71 y.o. M) Treating  RN: Carlene Coria Primary Care Provider: Carley Hammed Other Clinician: Referring Provider: Referral, Self Treating Provider/Extender: Skipper Cliche in Treatment: 0 Active Problems ICD-10 Encounter Code Description Active Date MDM Diagnosis I87.331 Chronic venous hypertension (idiopathic) with ulcer and inflammation of 11/14/2021 No Yes right lower extremity I89.0 Lymphedema, not elsewhere classified 11/14/2021 No Yes L97.812 Non-pressure chronic ulcer of other part of right lower leg with fat layer 11/14/2021 No Yes exposed [place] (primary) hypertension 11/14/2021 No Yes J44.9 Chronic obstructive pulmonary disease, unspecified 11/14/2021 No Yes Inactive Problems Resolved Problems Electronic Signature(s) Signed: 11/14/2021 9:35:29 AM By: Worthy Keeler PA-C Previous Signature: 11/14/2021 9:32:27 AM Version By: Worthy Keeler PA-C Entered By: Worthy Keeler on 11/14/2021 09:35:28 [place], Elliot Dally ([id number]) -------------------------------------------------------------------------------- Progress Note Details Patient Name: [name] Date of Service: 11/14/2021 8:45 AM Medical Record Number: [id number] Patient Account Number: [id number] Date of Birth/Sex: 1950-07-16 (71 y.o. 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 This has been present intermittently honestly since 1993. 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 There is a lot of scar tissue which I think is probably the reason why this reopens specifically in regard to the fact that he does have chronic venous insufficiency and lymphedema in his extremities. This means anytime his legs swell this is good to be the most likely area to weep. Right now its been about 3 weeks they have been trying to get this closed at this time and to be honest generally it closes much more quickly than that. That is the reason he has been seen today to see if we can get this closed. Has been on 3 rounds of antibiotics without benefit. On 10-01-2021 the patient did have stents placed bilaterally in the lower extremities and ablation on the left side done. This is outside of our visible EMR system I was not able to confirm this independently  but his arterial flow and ABIs in the clinic today was 1.33 on the right. He also was started on Augmentin on May 15 for 10-day course. That is done at this point. Otherwise he has a history of hypertension and COPD. Patient History Allergies Sulfa (Sulfonamide Antibiotics), Celebrex, Septra Social History Former smoker, Marital Status - Married, Alcohol Use - Never, Drug Use - No History, Caffeine Use - Daily. Medical History Respiratory Patient has history of Asthma, Chronic Obstructive Pulmonary Disease (COPD) Cardiovascular Patient has history of Hypertension Review of Systems (ROS) Integumentary (Skin) Complains or has symptoms of Wounds. Objective Constitutional sitting or standing blood pressure is within target range for patient.. pulse regular and within target range for patient.Marland Kitchen respirations regular, non- labored and within target range for patient.Marland Kitchen temperature within target range for patient.. Obese and well-hydrated in no acute distress. Vitals Time Taken: 8:48 AM, Height: 68 in, Source: Stated, Weight: 340 lbs, Source: Stated, BMI: 51.7, Temperature: 98 F, Pulse: 71  bpm, Respiratory Rate: 18 breaths/min, Blood Pressure: 130/85 mmHg. Eyes conjunctiva clear no eyelid edema noted. pupils equal round and reactive to light and accommodation. Ears, Nose, Mouth, and Throat no gross abnormality of ear auricles or external auditory canals. normal hearing noted during conversation. mucus membranes moist. Respiratory normal breathing without difficulty. [place] ([id number]) Cardiovascular 2+ dorsalis pedis/posterior tibialis pulses. no clubbing, cyanosis, significant edema, Musculoskeletal normal gait and posture. no significant deformity or arthritic changes, no loss or range of motion, no clubbing. Psychiatric this patient is able to make decisions and demonstrates good insight into disease process. Alert and Oriented x 3. pleasant and cooperative. General Notes: Upon inspection patient's wound bed actually showed signs of good granulation and minimal areas he does have a lot of slough and biofilm buildup noted at this time. I do believe we can need to see what we can do about getting this under control. He voiced understanding. Subsequently we agreed to perform sharp debridement I did debride the wound today to clearway slough and biofilm there was a significant portion of this that I was able to remove and postdebridement the wound bed appears to be doing much better. Integumentary (Hair, Skin) Wound #1 status is Open. Original cause of wound was Bite. The date acquired was: 12/15/1991. The wound is located on the Right Lower Leg. The wound measures 5cm length x 3.5cm width x 0.1cm depth; 13.744cm^2 area and 1.374cm^3 volume. There is Fat Layer (Subcutaneous Tissue) exposed. There is no tunneling or undermining noted. There is a medium amount of serosanguineous drainage noted. There is medium (34-66%) red granulation within the wound bed. There is a medium (34-66%) amount of necrotic tissue within the wound bed including Adherent  Slough. Assessment Active Problems ICD-10 Chronic venous hypertension (idiopathic) with ulcer and inflammation of right lower extremity Lymphedema, not elsewhere classified Non-pressure chronic ulcer of other part of right lower leg with fat layer exposed Essential (primary) hypertension Chronic obstructive pulmonary disease, unspecified Procedures Wound #1 Pre-procedure diagnosis of Wound #1 is a Venous Leg Ulcer located on the Right Lower Leg .Severity of Tissue Pre Debridement is: Fat layer exposed. There was a Excisional Skin/Subcutaneous Tissue Debridement with a total area of 17.5 sq cm performed by Tommie Sams., PA-C. With the following instrument(s): Curette to remove Viable and Non-Viable tissue/material. Material removed includes Subcutaneous Tissue, Slough, Skin: Dermis, Skin: Epidermis, and Biofilm. No specimens were taken. A time out was conducted at 09:47, prior to the start of the procedure. A Moderate amount  of bleeding was controlled with Pressure. The procedure was tolerated well with a pain level of 0 throughout and a pain level of 0 following the procedure. Post Debridement Measurements: 5cm length x 3.5cm width x 0.3cm depth; 4.123cm^3 volume. Character of Wound/Ulcer Post Debridement is improved. Severity of Tissue Post Debridement is: Fat layer exposed. Post procedure Diagnosis Wound #1: Same as Pre-Procedure Pre-procedure diagnosis of Wound #1 is a Venous Leg Ulcer located on the Right Lower Leg . There was a Three Layer Compression Therapy Procedure by Carlene Coria, RN. Post procedure Diagnosis Wound #1: Same as Pre-Procedure Plan Follow-up Appointments: Return Appointment in 1 week. Bathing/ Shower/ Hygiene: May shower with wound dressing protected with water repellent cover or cast protector. Edema Control - Lymphedema / Segmental Compressive Device / Other: Elevate, Exercise Daily and Avoid Standing for Long Periods of Time. Elevate legs to the level of the  heart and pump ankles as often as possible Elevate leg(s) parallel to the floor when sitting. Services and Therapies ordered were: Lymphedema Clinic WOUND #1: - Lower Leg Wound Laterality: Right Cleanser: Byram Ancillary Kit - 15 Day Supply 1 x Per Week/30 Days PAYNE, GARSKE ([id number]) Discharge Instructions: Use supplies as instructed; Kit contains: (15) Saline Bullets; (15) 3x3 Gauze; 15 pr Gloves Cleanser: Soap and Water 1 x Per Week/30 Days Discharge Instructions: Gently cleanse wound with antibacterial soap, rinse and pat dry prior to dressing wounds Primary Dressing: Silvercel Small 2x2 (in/in) 1 x Per Week/30 Days Discharge Instructions: Apply Silvercel Small 2x2 (in/in) as instructed Secondary Dressing: ABD Pad 5x9 (in/in) 1 x Per Week/30 Days Discharge Instructions: Cover with ABD pad Secondary Dressing: Gauze 1 x Per Week/30 Days Discharge Instructions: As directed: dry, moistened with saline or moistened with Dakins Solution Compression Wrap: Medichoice 4 layer Compression System, 35-40 mmHG 1 x Per Week/30 Days Discharge Instructions: Apply multi-layer wrap as directed. 1. I would recommend that we go ahead and initiate treatment with a silver alginate dressing. I think this is probably to be our best way to go currently. 2. Also can recommend that we have the patient continue to monitor for any signs of worsening infection. I would say if anything changes he should let me know where that being said that it mainly be if he is having any discomfort or abnormal symptoms in his leg. He otherwise he is can be wrapped with a 4 layer compression wrap. 3. He also has lymphedema pumps which have already been ordered with that being said he cannot get them till December because apparently there was an order placed 5 years ago in December he never received them but nonetheless apparently Medicare paid for them they were unaware of the fact that the patient never received the  unit. We will see patient back for reevaluation in 1 week here in the clinic. If anything worsens or changes patient will contact our office for additional recommendations. Electronic Signature(s) Signed: 11/14/2021 2:16:06 PM By: Worthy Keeler PA-C Entered By: Worthy Keeler on 11/14/2021 14:16:06 [place], Elliot Dally ([id number]) -------------------------------------------------------------------------------- ROS/PFSH Details Patient Name: [name] Date of Service: 11/14/2021 8:45 AM Medical Record Number: [id number] Patient Account Number: [id number] Date of Birth/Sex: 1950-11-06 (71 y.o. M) Treating RN: Carlene Coria Primary Care Provider: Carley Hammed Other Clinician: Referring Provider: Referral, Self Treating Provider/Extender: Skipper Cliche in Treatment: 0 Integumentary (Skin) Complaints and Symptoms: Positive for: Wounds Respiratory Medical History: Positive for: Asthma; Chronic Obstructive Pulmonary Disease (COPD) Cardiovascular Medical History: Positive for: Hypertension Immunizations  Pneumococcal Vaccine: Received Pneumococcal Vaccination: Yes Received Pneumococcal Vaccination On or After 60th Birthday: No Implantable Devices None Family and Social History Former smoker; Marital Status - Married; Alcohol Use: Never; Drug Use: No History; Caffeine Use: Daily Electronic Signature(s) Signed: 11/14/2021 4:26:56 PM By: Carlene Coria RN Signed: 11/14/2021 4:36:57 PM By: Worthy Keeler PA-C Entered By: Carlene Coria on 11/14/2021 08:53:07 [place], Elliot Dally ([id number]) -------------------------------------------------------------------------------- [place] Details Patient Name: [name] Date of Service: 11/14/2021 Medical Record Number: [id number] Patient Account Number: [id number] Date of Birth/Sex: 09-05-1950 (71 y.o. M) Treating RN: Carlene Coria Primary Care Provider: Carley Hammed Other Clinician: Referring Provider: Referral,  Self Treating Provider/Extender: Skipper Cliche in Treatment: 0 Diagnosis Coding ICD-10 Codes Code Description I87.331 Chronic venous hypertension (idiopathic) with ulcer and inflammation of right lower extremity I89.0 Lymphedema, not elsewhere classified L97.812 Non-pressure chronic ulcer of other part of right lower leg with fat layer exposed I10 Essential (primary) hypertension J44.9 Chronic obstructive pulmonary disease, unspecified Facility Procedures CPT4 Code: 18867737 Description: [place] VISIT-LEV 3 EST PT Modifier: Quantity: 1 CPT4 Code: [id number] Description: 11042 - DEB SUBQ TISSUE 20 SQ CM/< Modifier: Quantity: 1 CPT4 Code: Description: ICD-10 Diagnosis Description L07.615 Non-pressure chronic ulcer of other part of right lower leg with fat laye Modifier: r exposed Quantity: Physician Procedures CPT4 Code Description: [id number] WC PHYS LEVEL 3 o NEW PT Modifier: 25 Quantity: 1 CPT4 Code Description: ICD-10 Diagnosis Description I87.331 Chronic venous hypertension (idiopathic) with ulcer and inflammation of ri I89.0 Lymphedema, not elsewhere classified L97.812 Non-pressure chronic ulcer of other part of right lower leg with fat  layer I10 Essential (primary) hypertension Modifier: ght lower extremit exposed Quantity: y CPT4 Code Description: [id number] 78412 - WC PHYS SUBQ TISS 20 SQ CM Modifier: Quantity: 1 CPT4 Code Description: ICD-10 Diagnosis Description K20.813 Non-pressure chronic ulcer of other part of right lower leg with fat layer Modifier: exposed Quantity: Electronic Signature(s) Signed: 11/14/2021 2:17:22 PM By: Worthy Keeler PA-C Entered By: Worthy Keeler on 11/14/2021 14:17:22

## 2021-11-14 NOTE — Progress Notes (Signed)
JALEIL, RENWICK ([id number]) Visit Report for 11/14/2021 Abuse Risk Screen Details Patient Name: [name], [name]. Date of Service: 11/14/2021 8:45 AM Medical Record Number: [id number] Patient Account Number: [id number] Date of Birth/Sex: 1950/09/20 (71 y.o. M) Treating RN: Yevonne Pax Primary Care Amalee Olsen: Ventura Sellers Other Clinician: Referring Thana Ramp: Referral, Self Treating Almeda Ezra/Extender: Rowan Blase in Treatment: 0 Abuse Risk Screen Items Answer ABUSE RISK SCREEN: Has anyone close to you tried to hurt or harm you recentlyo No Do you feel uncomfortable with anyone in your familyo No Has anyone forced you do things that you didnot want to doo No Electronic Signature(s) Signed: 11/14/2021 4:26:56 PM By: Yevonne Pax RN Entered By: Yevonne Pax on 11/14/2021 08:53:17 Arpin, [name] ([id number]) -------------------------------------------------------------------------------- Activities of Daily Living Details Patient Name: [name]. Date of Service: 11/14/2021 8:45 AM Medical Record Number: [id number] Patient Account Number: [id number] Date of Birth/Sex: August 03, 1950 (71 y.o. M) Treating RN: Yevonne Pax Primary Care Diyan Dave: Ventura Sellers Other Clinician: Referring Haniyyah Sakuma: Referral, Self Treating Thanvi Blincoe/Extender: Rowan Blase in Treatment: 0 Activities of Daily Living Items Answer Activities of Daily Living (Please select one for each item) Drive Automobile Completely Able Take Medications Completely Able Use Telephone Completely Able Care for Appearance Completely Able Use Toilet Completely Able Bath / Shower Completely Able Dress Self Completely Able Feed Self Completely Able Walk Completely Able Get In / Out Bed Completely Able Housework Completely Able Prepare Meals Completely Able Handle Money Completely Able Shop for Self Completely Able Electronic Signature(s) Signed: 11/14/2021 4:26:56 PM By: Yevonne Pax RN Entered  By: Yevonne Pax on 11/14/2021 08:53:46 Burling, [name] ([id number]) -------------------------------------------------------------------------------- Education Screening Details Patient Name: [name] Date of Service: 11/14/2021 8:45 AM Medical Record Number: [id number] Patient Account Number: [id number] Date of Birth/Sex: 08/12/1950 (71 y.o. M) Treating RN: Yevonne Pax Primary Care Brooklynne Pereida: Ventura Sellers Other Clinician: Referring Ayane Delancey: Referral, Self Treating Omare Bilotta/Extender: Rowan Blase in Treatment: 0 Primary Learner Assessed: Patient Learning Preferences/Education Level/Primary Language Learning Preference: Explanation Highest Education Level: High School Preferred Language: English Cognitive Barrier Language Barrier: No Translator Needed: No Memory Deficit: No Emotional Barrier: No Cultural/Religious Beliefs Affecting Medical Care: No Physical Barrier Impaired Vision: Yes Glasses Impaired Hearing: No Decreased Hand dexterity: No Knowledge/Comprehension Knowledge Level: Medium Comprehension Level: High Ability to understand written instructions: High Ability to understand verbal instructions: High Motivation Anxiety Level: Anxious Cooperation: Cooperative Education Importance: Acknowledges Need Interest in Health Problems: Asks Questions Perception: Coherent Willingness to Engage in Self-Management High Activities: Readiness to Engage in Self-Management High Activities: Electronic Signature(s) Signed: 11/14/2021 4:26:56 PM By: Yevonne Pax RN Entered By: Yevonne Pax on 11/14/2021 08:54:11 [name] ([id number]) -------------------------------------------------------------------------------- Fall Risk Assessment Details Patient Name: [name] Date of Service: 11/14/2021 8:45 AM Medical Record Number: [id number] Patient Account Number: [id number] Date of Birth/Sex: [birth year] (71 y.o. M) Treating RN: Yevonne Pax Primary Care Mitchell Iwanicki: Ventura Sellers Other Clinician: Referring Keyona Emrich: Referral, Self Treating Tyreik Delahoussaye/Extender: Rowan Blase in Treatment: 0 Fall Risk Assessment Items Have you had 2 or more falls in the last 12 monthso 0 No Have you had any fall that resulted in injury in the last 12 monthso 0 No FALLS RISK SCREEN History of falling - immediate or within 3 months 0 No Secondary diagnosis (Do you have 2 or more medical diagnoseso) 0 No Ambulatory aid None/bed rest/wheelchair/nurse 0 No Crutches/cane/walker 0 No Furniture 0 No Intravenous therapy Access/Saline/Heparin Lock 0 No Gait/Transferring Normal/ bed rest/ wheelchair 0 No Weak (short steps with  or without shuffle, stooped but able to lift head while walking, may 0 No seek support from furniture) Impaired (short steps with shuffle, may have difficulty arising from chair, head down, impaired 0 No balance) Mental Status Oriented to own ability 0 No Electronic Signature(s) Signed: 11/14/2021 4:26:56 PM By: Yevonne Pax RN Entered By: Yevonne Pax on 11/14/2021 08:54:17 Scarlett, [name] ([id number]) -------------------------------------------------------------------------------- Foot Assessment Details Patient Name: [name] Date of Service: 11/14/2021 8:45 AM Medical Record Number: [id number] Patient Account Number: [id number] Date of Birth/Sex: 1950-07-07 (71 y.o. M) Treating RN: Yevonne Pax Primary Care Chala Gul: Ventura Sellers Other Clinician: Referring Rahmir Beever: Referral, Self Treating Marcos Peloso/Extender: Rowan Blase in Treatment: 0 Foot Assessment Items Site Locations + = Sensation present, - = Sensation absent, C = Callus, U = Ulcer R = Redness, W = Warmth, M = Maceration, PU = Pre-ulcerative lesion F = Fissure, S = Swelling, D = Dryness Assessment Right: Left: Other Deformity: No No Prior Foot Ulcer: No No Prior Amputation: No No Charcot Joint: No No Ambulatory Status:  Ambulatory Without Help Gait: Steady Electronic Signature(s) Signed: 11/14/2021 4:26:56 PM By: Yevonne Pax RN Entered By: Yevonne Pax on 11/14/2021 08:59:06 Starlin, [name] ([id number]) -------------------------------------------------------------------------------- Nutrition Risk Screening Details Patient Name: [name] Date of Service: 11/14/2021 8:45 AM Medical Record Number: [id number] Patient Account Number: [id number] Date of Birth/Sex: 1951-04-02 (71 y.o. M) Treating RN: Yevonne Pax Primary Care Ronasia Isola: Ventura Sellers Other Clinician: Referring Dayshon Roback: Referral, Self Treating Aidynn Polendo/Extender: Rowan Blase in Treatment: 0 Height (in): 68 Weight (lbs): 340 Body Mass Index (BMI): 51.7 Nutrition Risk Screening Items Score Screening NUTRITION RISK SCREEN: I have an illness or condition that made me change the kind and/or amount of food I eat 0 No I eat fewer than two meals per day 0 No I eat few fruits and vegetables, or milk products 0 No I have three or more drinks of beer, liquor or wine almost every day 0 No I have tooth or mouth problems that make it hard for me to eat 0 No I don't always have enough money to buy the food I need 0 No I eat alone most of the time 0 No I take three or more different prescribed or over-the-counter drugs a day 1 Yes Without wanting to, I have lost or gained 10 pounds in the last six months 0 No I am not always physically able to shop, cook and/or feed myself 0 No Nutrition Protocols Good Risk Protocol 0 No interventions needed Moderate Risk Protocol High Risk Proctocol Risk Level: Good Risk Score: 1 Electronic Signature(s) Signed: 11/14/2021 4:26:56 PM By: Yevonne Pax RN Entered By: Yevonne Pax on 11/14/2021 08:54:28

## 2021-11-21 ENCOUNTER — Encounter: Payer: Medicare Other | Admitting: Physician Assistant

## 2021-11-21 ENCOUNTER — Ambulatory Visit: Payer: Medicare Other | Admitting: Physician Assistant

## 2021-11-21 DIAGNOSIS — I87331 Chronic venous hypertension (idiopathic) with ulcer and inflammation of right lower extremity: Secondary | ICD-10-CM | POA: Diagnosis not present

## 2021-11-21 NOTE — Progress Notes (Signed)
TAARIQ, LEITZ ([id number]) Visit Report for 11/21/2021 Chief Complaint Document Details Patient Name: [name], [name]. Date of Service: 11/21/2021 1:15 PM Medical Record Number: [id number] Patient Account Number: [id number] Date of Birth/Sex: [birth year] (71 y.o. 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 M) Treating RN: Carlene Coria Primary Care Provider: Carley Hammed Other Clinician: Referring Provider: Carley Hammed Treating Provider/Extender: Skipper Cliche in Treatment: 1 History of Present Illness HPI Description: 11-14-2021 upon evaluation today patient presents for initial evaluation here in our clinic concerning issues he is having with his right lower extremity he does have a wound on the posterior aspect. This has been present intermittently honestly since 1993. 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 There is a lot of scar tissue which I think is probably the reason why this reopens specifically in regard to the fact that he does have chronic venous insufficiency and lymphedema in his extremities. This means anytime his legs swell this is good to be the most likely area to  weep. Right now its been about 3 weeks they have been trying to get this closed at this time and to be honest generally it closes much more quickly than that. That is the reason he has been seen today to see if we can get this closed. Has been on 3 rounds of antibiotics without benefit. On 10-01-2021 the patient did have stents placed bilaterally in the lower extremities and ablation on the left side done. This is outside of our visible EMR system I was not able to confirm this independently but his arterial flow and ABIs in the clinic today was 1.33 on the right. He also was started on Augmentin on May 15 for 10-day course. That is done at this point. Otherwise he has a history of hypertension and COPD. 11-21-2021 upon evaluation today patient appears to be doing well with regard to his wound. In fact this is showing signs of good improvement. It still slow to heal but nonetheless I think we are seeing improvements in size and overall appearance of the wound bed and general. I am very pleased in that regard. Electronic Signature(s) Signed: 11/21/2021 5:07:29 PM By: Worthy Keeler PA-C Entered By: Worthy Keeler on 11/21/2021 17:07:28 [name] ([id number]) -------------------------------------------------------------------------------- Physical Exam Details Patient Name: [name] Date of Service: 11/21/2021 1:15 PM Medical Record Number: [id number] Patient Account Number: [id number] Date of Birth/Sex: 1951-02-23 (71 y.o. M) Treating RN: Carlene Coria Primary Care Provider: Carley Hammed Other Clinician: Referring Provider: Carley Hammed Treating Provider/Extender: Skipper Cliche in Treatment: 1 Constitutional Obese and well-hydrated in no acute distress. Respiratory normal breathing without difficulty. Psychiatric this patient is able to make decisions and demonstrates good insight into disease process. Alert and Oriented x 3. pleasant and cooperative. Notes Upon  inspection patient's wound bed showed evidence of good granulation and epithelization there was no sharp debridement  performed today was having a lot of discomfort I was able to get most of the slough and biofilm off which is saline and gauze no sharp debridement necessary at this point we will see how things do over the next week. Electronic Signature(s) Signed: 11/21/2021 5:07:45 PM By: Worthy Keeler PA-C Entered By: Worthy Keeler on 11/21/2021 17:07:45 Winsor, Elliot Dally ([id number]) -------------------------------------------------------------------------------- Physician Orders Details Patient Name: [name] Date of Service: 11/21/2021 1:15 PM Medical Record Number: [id number] Patient Account Number: [id number] Date of Birth/Sex: July 10, 1950 (71 y.o. M) Treating RN: Carlene Coria Primary Care Provider: Carley Hammed Other Clinician: Referring Provider: Carley Hammed Treating Provider/Extender: Skipper Cliche in Treatment: 1 Verbal / Phone Orders: No Diagnosis Coding ICD-10 Coding Code Description I87.331 Chronic venous hypertension (idiopathic) with ulcer and inflammation of right lower extremity I89.0 Lymphedema, not elsewhere classified L97.812 Non-pressure chronic ulcer of other part of right lower leg with fat layer exposed I10 Essential (primary) hypertension J44.9 Chronic obstructive pulmonary disease, unspecified Follow-up Appointments o Return Appointment in 1 week. Bathing/ Shower/ Hygiene o May shower with wound dressing protected with water repellent cover or cast protector. Edema Control - Lymphedema / Segmental Compressive Device / Other o Elevate, Exercise Daily and Avoid Standing for Long Periods of Time. o Elevate legs to the level of the heart and pump ankles as often as possible o Elevate leg(s) parallel to the floor when sitting. Wound Treatment Wound #1 - Lower Leg Wound Laterality: Right Cleanser: Byram Ancillary Kit - 15 Day  Supply 1 x Per Week/30 Days Discharge Instructions: Use supplies as instructed; Kit contains: (15) Saline Bullets; (15) 3x3 Gauze; 15 pr Gloves Cleanser: Soap and Water 1 x Per Week/30 Days Discharge Instructions: Gently cleanse wound with antibacterial soap, rinse and pat dry prior to dressing wounds Primary Dressing: Silvercel Small 2x2 (in/in) 1 x Per Week/30 Days Discharge Instructions: Apply Silvercel Small 2x2 (in/in) as instructed Secondary Dressing: ABD Pad 5x9 (in/in) 1 x Per Week/30 Days Discharge Instructions: Cover with ABD pad Secondary Dressing: Gauze 1 x Per Week/30 Days Discharge Instructions: As directed: dry, moistened with saline or moistened with Dakins Solution Compression Wrap: Medichoice 4 layer Compression System, 35-40 mmHG 1 x Per Week/30 Days Discharge Instructions: Apply multi-layer wrap as directed. Electronic Signature(s) Signed: 11/21/2021 5:38:41 PM By: Worthy Keeler PA-C Signed: 11/25/2021 2:49:16 PM By: Carlene Coria RN Entered By: Carlene Coria on 11/21/2021 14:00:59 [name] ([id number]) -------------------------------------------------------------------------------- Problem List Details Patient Name: LOXLEY, SCHMALE Date of Service: 11/21/2021 1:15 PM Medical Record Number: [id number] Patient Account Number: [id number] Date of Birth/Sex: [birth year] (71 y.o. M) Treating RN: Carlene Coria Primary Care Provider: Carley Hammed Other Clinician: Referring Provider: Carley Hammed Treating Provider/Extender: Skipper Cliche in Treatment: 1 Active Problems ICD-10 Encounter Code Description Active Date MDM Diagnosis I87.331 Chronic venous hypertension (idiopathic) with ulcer and inflammation of 11/14/2021 No Yes right lower extremity I89.0 Lymphedema, not elsewhere classified 11/14/2021 No Yes L97.812 Non-pressure chronic ulcer of other part of right lower leg with fat layer 11/14/2021 No Yes exposed Arnot (primary) hypertension  11/14/2021 No Yes J44.9 Chronic obstructive pulmonary disease, unspecified 11/14/2021 No Yes Inactive Problems Resolved Problems Electronic Signature(s) Signed: 11/21/2021 1:37:23 PM By: Worthy Keeler PA-C Entered By: Worthy Keeler on 11/21/2021 13:37:23 Curto, Elliot Dally ([id number]) -------------------------------------------------------------------------------- Progress Note Details Patient Name: [name] Date of Service: 11/21/2021 1:15 PM Medical Record Number: [id number] Patient Account Number: [id number] Date of Birth/Sex: 1950/09/07 (71 y.o. M) Treating RN:  Carlene Coria Primary Care Provider: Carley Hammed Other Clinician: Referring Provider: Carley Hammed Treating Provider/Extender: Skipper Cliche in Treatment: 1 Subjective Chief Complaint Information obtained from Patient Right leg ulcer with bilateral venous stasis History of Present Illness (HPI) 11-14-2021 upon evaluation today patient presents for initial evaluation here in our clinic concerning issues he is having with his right lower extremity he does have a wound on the posterior aspect. This has been present intermittently honestly since 1993. 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 There is a lot of scar tissue which I think is probably the reason why this reopens specifically in regard to the fact that he does have chronic venous insufficiency and lymphedema in his extremities. This means anytime his legs swell this is good to be the most likely area to weep. Right now its been about 3 weeks they have been trying to get this closed at this time and to be honest generally it closes much more quickly than that. That is the reason he has been seen today to see if we can get this closed. Has been on 3 rounds of antibiotics without benefit. On 10-01-2021 the patient did have  stents placed bilaterally in the lower extremities and ablation on the left side done. This is outside of our visible EMR system I was not able to confirm this independently but his arterial flow and ABIs in the clinic today was 1.33 on the right. He also was started on Augmentin on May 15 for 10-day course. That is done at this point. Otherwise he has a history of hypertension and COPD. 11-21-2021 upon evaluation today patient appears to be doing well with regard to his wound. In fact this is showing signs of good improvement. It still slow to heal but nonetheless I think we are seeing improvements in size and overall appearance of the wound bed and general. I am very pleased in that regard. Objective Constitutional Obese and well-hydrated in no acute distress. Vitals Time Taken: 1:13 PM, Height: 68 in, Weight: 340 lbs, BMI: 51.7, Temperature: 98.1 F, Pulse: 71 bpm, Respiratory Rate: 16 breaths/min, Blood Pressure: 177/94 mmHg. Respiratory normal breathing without difficulty. Psychiatric this patient is able to make decisions and demonstrates good insight into disease process. Alert and Oriented x 3. pleasant and cooperative. General Notes: Upon inspection patient's wound bed showed evidence of good granulation and epithelization there was no sharp debridement performed today was having a lot of discomfort I was able to get most of the slough and biofilm off which is saline and gauze no sharp debridement necessary at this point we will see how things do over the next week. Integumentary (Hair, Skin) Wound #1 status is Open. Original cause of wound was Bite. The date acquired was: 12/15/1991. The wound has been in treatment 1 weeks. The wound is located on the Right Lower Leg. The wound measures 4.5cm length x 3.5cm width x 0.1cm depth; 12.37cm^2 area and 1.237cm^3 volume. There is Fat Layer (Subcutaneous Tissue) exposed. There is no tunneling or undermining noted. There is a medium amount  of serosanguineous drainage noted. There is medium (34-66%) red granulation within the wound bed. There is a medium (34-66%) amount of necrotic tissue within the wound bed including Adherent Slough. Assessment MILAN, PERKINS ([id number]) Active Problems ICD-10 Chronic venous hypertension (idiopathic) with ulcer and inflammation of right lower  extremity Lymphedema, not elsewhere classified Non-pressure chronic ulcer of other part of right lower leg with fat layer exposed Essential (primary) hypertension Chronic obstructive pulmonary disease, unspecified Procedures Wound #1 Pre-procedure diagnosis of Wound #1 is a Venous Leg Ulcer located on the Right Lower Leg . There was a Four Layer Compression Therapy Procedure by Carlene Coria, RN. Post procedure Diagnosis Wound #1: Same as Pre-Procedure Plan Follow-up Appointments: Return Appointment in 1 week. Bathing/ Shower/ Hygiene: May shower with wound dressing protected with water repellent cover or cast protector. Edema Control - Lymphedema / Segmental Compressive Device / Other: Elevate, Exercise Daily and Avoid Standing for Long Periods of Time. Elevate legs to the level of the heart and pump ankles as often as possible Elevate leg(s) parallel to the floor when sitting. WOUND #1: - Lower Leg Wound Laterality: Right Cleanser: Byram Ancillary Kit - 15 Day Supply 1 x Per Week/30 Days Discharge Instructions: Use supplies as instructed; Kit contains: (15) Saline Bullets; (15) 3x3 Gauze; 15 pr Gloves Cleanser: Soap and Water 1 x Per Week/30 Days Discharge Instructions: Gently cleanse wound with antibacterial soap, rinse and pat dry prior to dressing wounds Primary Dressing: Silvercel Small 2x2 (in/in) 1 x Per Week/30 Days Discharge Instructions: Apply Silvercel Small 2x2 (in/in) as instructed Secondary Dressing: ABD Pad 5x9 (in/in) 1 x Per Week/30 Days Discharge Instructions: Cover with ABD pad Secondary Dressing: Gauze 1 x Per Week/30  Days Discharge Instructions: As directed: dry, moistened with saline or moistened with Dakins Solution Compression Wrap: Medichoice 4 layer Compression System, 35-40 mmHG 1 x Per Week/30 Days Discharge Instructions: Apply multi-layer wrap as directed. 1. I am good to recommend currently that we go ahead and continue with the wound care measures as before and the patient is in agreement with plan this includes the use of the silver alginate dressing followed by an ABD pad. 2. I am also can recommend that we continue with the 4-layer compression wrap that swelling is significantly smaller than last week. 3. Were also to see about getting him a Velcro compression wrap although we need to make sure that we get the size right on this therefore Minna hold off ordering this until next week when we get a better idea of what size his leg really needs to be kept at. We will see patient back for reevaluation in 1 week here in the clinic. If anything worsens or changes patient will contact our office for additional recommendations. Electronic Signature(s) Signed: 11/21/2021 5:08:44 PM By: Worthy Keeler PA-C Entered By: Worthy Keeler on 11/21/2021 17:08:44 Brennen, Elliot Dally ([id number]) -------------------------------------------------------------------------------- SuperBill Details Patient Name: [name] Date of Service: 11/21/2021 Medical Record Number: [id number] Patient Account Number: [id number] Date of Birth/Sex: 1951-05-27 (71 y.o. M) Treating RN: Carlene Coria Primary Care Provider: Carley Hammed Other Clinician: Referring Provider: Carley Hammed Treating Provider/Extender: Skipper Cliche in Treatment: 1 Diagnosis Coding ICD-10 Codes Code Description [phone] Chronic venous hypertension (idiopathic) with ulcer and inflammation of right lower extremity I89.0 Lymphedema, not elsewhere classified L97.812 Non-pressure chronic ulcer of other part of right lower leg with fat  layer exposed I10 Essential (primary) hypertension J44.9 Chronic obstructive pulmonary disease, unspecified Facility Procedures CPT4: Description Modifier Quantity Code [id number] BILATERAL: Application of multi-layer venous compression system; leg (below knee), including 1 ankle and foot. Physician Procedures CPT4 Code Description: [id number] - WC PHYS LEVEL 3 - EST PT Modifier: Quantity: 1 CPT4 Code Description: ICD-10 Diagnosis Description I87.331 Chronic venous hypertension (idiopathic) with ulcer and  inflammation of I89.0 Lymphedema, not elsewhere classified L97.812 Non-pressure chronic ulcer of other part of right lower leg with fat la  I10 Essential (primary) hypertension Modifier: right lower extremit yer exposed Quantity: y Engineer, maintenance) Signed: 11/21/2021 5:09:30 PM By: Worthy Keeler PA-C Entered By: Worthy Keeler on 11/21/2021 17:09:30

## 2021-11-25 ENCOUNTER — Ambulatory Visit: Payer: Self-pay | Admitting: Physician Assistant

## 2021-11-25 NOTE — Progress Notes (Signed)
AUDREY, ELLER ([id number]) Visit Report for 11/21/2021 Arrival Information Details Patient Name: [name], [name] Date of Service: 11/21/2021 1:15 PM Medical Record Number: [id number] Patient Account Number: [id number] Date of Birth/Sex: [birth year] (71 y.o. M) Treating RN: Carlene Coria Primary Care Dawsyn Zurn: Carley Hammed Other Clinician: Referring Chevis Weisensel: Carley Hammed Treating Katai Marsico/Extender: Skipper Cliche in Treatment: 1 Visit Information History Since Last Visit All ordered tests and consults were completed: No Patient Arrived: Ambulatory Added or deleted any medications: No Arrival Time: 13:09 Any new allergies or adverse reactions: No Accompanied By: self Had a fall or experienced change in No Transfer Assistance: None activities of daily living that may affect Patient Identification Verified: Yes risk of falls: Secondary Verification Process Completed: Yes Signs or symptoms of abuse/neglect since last visito No Patient Requires Transmission-Based Precautions: No Hospitalized since last visit: No Patient Has Alerts: No Implantable device outside of the clinic excluding No cellular tissue based products placed in the center since last visit: Has Dressing in Place as Prescribed: Yes Has Compression in Place as Prescribed: Yes Pain Present Now: No Electronic Signature(s) Signed: 11/25/2021 2:49:16 PM By: Carlene Coria RN Entered By: Carlene Coria on 11/21/2021 13:14:18 [name], [name] ([id number]) -------------------------------------------------------------------------------- Clinic Level of Care Assessment Details Patient Name: [name] Date of Service: 11/21/2021 1:15 PM Medical Record Number: [id number] Patient Account Number: [id number] Date of Birth/Sex: May 04, 1951 (71 y.o. M) Treating RN: Carlene Coria Primary Care Ellese Julius: Carley Hammed Other Clinician: Referring Marie Chow: Carley Hammed Treating Brain Honeycutt/Extender: Skipper Cliche in Treatment: 1 Clinic Level of Care Assessment Items TOOL 1 Quantity Score []  - Use when EandM and Procedure is performed on INITIAL visit 0 ASSESSMENTS - Nursing Assessment / Reassessment []  - General Physical Exam (combine w/ comprehensive assessment (listed just below) when performed on new 0 pt. evals) []  - 0 Comprehensive Assessment (HX, ROS, Risk Assessments, Wounds Hx, etc.) ASSESSMENTS - Wound and Skin Assessment / Reassessment []  - Dermatologic / Skin Assessment (not related to wound area) 0 ASSESSMENTS - Ostomy and/or Continence Assessment and Care []  - Incontinence Assessment and Management 0 []  - 0 Ostomy Care Assessment and Management (repouching, etc.) PROCESS - Coordination of Care []  - Simple Patient / Family Education for ongoing care 0 []  - 0 Complex (extensive) Patient / Family Education for ongoing care []  - 0 Staff obtains Programmer, systems, Records, Test Results / Process Orders []  - 0 Staff telephones HHA, Nursing Homes / Clarify orders / etc []  - 0 Routine Transfer to another Facility (non-emergent condition) []  - 0 Routine Hospital Admission (non-emergent condition) []  - 0 New Admissions / Biomedical engineer / Ordering NPWT, Apligraf, etc. []  - 0 Emergency Hospital Admission (emergent condition) PROCESS - Special Needs []  - Pediatric / Minor Patient Management 0 []  - 0 Isolation Patient Management []  - 0 Hearing / Language / Visual special needs []  - 0 Assessment of Community assistance (transportation, D/C planning, etc.) []  - 0 Additional assistance / Altered mentation []  - 0 Support Surface(s) Assessment (bed, cushion, seat, etc.) INTERVENTIONS - Miscellaneous []  - External ear exam 0 []  - 0 Patient Transfer (multiple staff / Civil Service fast streamer / Similar devices) []  - 0 Simple Staple / Suture removal (25 or less) []  - 0 Complex Staple / Suture removal (26 or more) []  - 0 Hypo/Hyperglycemic Management (do not check if billed  separately) []  - 0 Ankle / Brachial Index (ABI) - do not check if billed separately Has the patient been seen at the hospital within the last  three years: Yes Total Score: 0 Level Of Care: ____ [name] ([id number]) Electronic Signature(s) Signed: 11/25/2021 2:49:16 PM By: Carlene Coria RN Entered By: Carlene Coria on 11/21/2021 14:01:08 [name], [name] ([id number]) -------------------------------------------------------------------------------- Compression Therapy Details Patient Name: [name] Date of Service: 11/21/2021 1:15 PM Medical Record Number: [id number] Patient Account Number: [id number] Date of Birth/Sex: 11-Jun-1951 (71 y.o. M) Treating RN: Carlene Coria Primary Care Harjot Dibello: Carley Hammed Other Clinician: Referring Kenly Henckel: Carley Hammed Treating Suezette Lafave/Extender: Skipper Cliche in Treatment: 1 Compression Therapy Performed for Wound Assessment: Wound #1 Right Lower Leg Performed By: Clinician Carlene Coria, RN Compression Type: Four Layer Post Procedure Diagnosis Same as Pre-procedure Electronic Signature(s) Signed: 11/25/2021 2:49:16 PM By: Carlene Coria RN Entered By: Carlene Coria on 11/21/2021 14:00:44 [name] ([id number]) -------------------------------------------------------------------------------- Encounter Discharge Information Details Patient Name: [name] Date of Service: 11/21/2021 1:15 PM Medical Record Number: [id number] Patient Account Number: [id number] Date of Birth/Sex: 1951-03-04 (71 y.o. M) Treating RN: Carlene Coria Primary Care Destinae Neubecker: Carley Hammed Other Clinician: Referring Dianey Suchy: Carley Hammed Treating Alexus Galka/Extender: Skipper Cliche in Treatment: 1 Encounter Discharge Information Items Discharge Condition: Stable Ambulatory Status: Ambulatory Discharge Destination: Home Transportation: Private Auto Accompanied By: wife Schedule Follow-up Appointment: Yes Clinical  Summary of Care: Patient Declined Electronic Signature(s) Signed: 11/25/2021 2:49:16 PM By: Carlene Coria RN Entered By: Carlene Coria on 11/21/2021 14:02:25 [name] ([id number]) -------------------------------------------------------------------------------- Lower Extremity Assessment Details Patient Name: [name] Date of Service: 11/21/2021 1:15 PM Medical Record Number: [id number] Patient Account Number: [id number] Date of Birth/Sex: 1950-08-07 (71 y.o. M) Treating RN: Carlene Coria Primary Care Nanea Jared: Carley Hammed Other Clinician: Referring Keidra Withers: Carley Hammed Treating Kandiss Ihrig/Extender: Skipper Cliche in Treatment: 1 Edema Assessment Assessed: [Left: No] [Right: No] Edema: [Left: Ye] [Right: s] Calf Left: Right: Point of Measurement: 37 cm From Medial Instep 57 cm Ankle Left: Right: Point of Measurement: 13 cm From Medial Instep 34 cm Vascular Assessment Pulses: Dorsalis Pedis Palpable: [Right:Yes] Electronic Signature(s) Signed: 11/25/2021 2:49:16 PM By: Carlene Coria RN Entered By: Carlene Coria on 11/21/2021 13:24:49 [name], [name] ([id number]) -------------------------------------------------------------------------------- Multi Wound Chart Details Patient Name: [name] Date of Service: 11/21/2021 1:15 PM Medical Record Number: [id number] Patient Account Number: [id number] Date of Birth/Sex: 01-Jul-1950 (71 y.o. M) Treating RN: Carlene Coria Primary Care Jamarkis Branam: Carley Hammed Other Clinician: Referring Antwan Bribiesca: Carley Hammed Treating Jolanta Cabeza/Extender: Skipper Cliche in Treatment: 1 Vital Signs Height(in): 70 Pulse(bpm): 41 Weight(lbs): 340 Blood Pressure(mmHg): 177/94 Body Mass Index(BMI): 51.7 Temperature(F): 98.1 Respiratory Rate(breaths/min): 16 Photos: [N/A:N/A] Wound Location: Right Lower Leg N/A N/A Wounding Event: Bite N/A N/A Primary Etiology: Venous Leg Ulcer N/A N/A Comorbid History:  Asthma, Chronic Obstructive N/A N/A Pulmonary Disease (COPD), Hypertension Date Acquired: 12/15/1991 N/A N/A Weeks of Treatment: 1 N/A N/A Wound Status: Open N/A N/A Wound Recurrence: No N/A N/A Measurements L x W x D (cm) 4.5x3.5x0.1 N/A N/A Area (cm) : 12.37 N/A N/A Volume (cm) : 1.237 N/A N/A % Reduction in Area: 10.00% N/A N/A % Reduction in Volume: 10.00% N/A N/A Classification: Full Thickness Without Exposed N/A N/A Support Structures Exudate Amount: Medium N/A N/A Exudate Type: Serosanguineous N/A N/A Exudate Color: red, brown N/A N/A Granulation Amount: Medium (34-66%) N/A N/A Granulation Quality: Red N/A N/A Necrotic Amount: Medium (34-66%) N/A N/A Exposed Structures: Fat Layer (Subcutaneous Tissue): N/A N/A Yes Fascia: No Tendon: No Muscle: No Joint: No Bone: No Epithelialization: None N/A N/A Treatment Notes Electronic Signature(s) Signed: 11/25/2021 2:49:16 PM By: Dolores Lory,  Morey Hummingbird RN Entered By: Carlene Coria on 11/21/2021 13:24:59 [name] ([id number]) -------------------------------------------------------------------------------- [place] Details Patient Name: MOLLY, MASELLI. Date of Service: 11/21/2021 1:15 PM Medical Record Number: [id number] Patient Account Number: [id number] Date of Birth/Sex: [birth year] (71 y.o. M) Treating RN: Carlene Coria Primary Care Nannette Zill: Carley Hammed Other Clinician: Referring Florabelle Cardin: Carley Hammed Treating Awilda Covin/Extender: Skipper Cliche in Treatment: 1 Active Inactive Wound/Skin Impairment Nursing Diagnoses: Knowledge deficit related to ulceration/compromised skin integrity Goals: Patient/caregiver will verbalize understanding of skin care regimen Date Initiated: 11/14/2021 Target Resolution Date: 12/14/2021 Goal Status: Active Ulcer/skin breakdown will have a volume reduction of 30% by week 4 Date Initiated: 11/14/2021 Target Resolution Date: 12/14/2021 Goal Status:  Active Ulcer/skin breakdown will have a volume reduction of 50% by week 8 Date Initiated: 11/14/2021 Target Resolution Date: 01/14/2022 Goal Status: Active Ulcer/skin breakdown will have a volume reduction of 80% by week 12 Date Initiated: 11/14/2021 Target Resolution Date: 02/14/2022 Goal Status: Active Ulcer/skin breakdown will heal within 14 weeks Date Initiated: 11/14/2021 Target Resolution Date: 03/16/2022 Goal Status: Active Interventions: Assess patient/caregiver ability to obtain necessary supplies Assess patient/caregiver ability to perform ulcer/skin care regimen upon admission and as needed Assess ulceration(s) every visit Notes: Electronic Signature(s) Signed: 11/25/2021 2:49:16 PM By: Carlene Coria RN Entered By: Carlene Coria on 11/21/2021 13:24:53 [name], [name] ([id number]) -------------------------------------------------------------------------------- Pain Assessment Details Patient Name: [name] Date of Service: 11/21/2021 1:15 PM Medical Record Number: [id number] Patient Account Number: [id number] Date of Birth/Sex: February 02, 1951 (71 y.o. M) Treating RN: Carlene Coria Primary Care Lewis Keats: Carley Hammed Other Clinician: Referring Yaeko Fazekas: Carley Hammed Treating Paytyn Mesta/Extender: Skipper Cliche in Treatment: 1 Active Problems Location of Pain Severity and Description of Pain Patient Has Paino No Site Locations Pain Management and Medication Current Pain Management: Electronic Signature(s) Signed: 11/25/2021 2:49:16 PM By: Carlene Coria RN Entered By: Carlene Coria on 11/21/2021 13:14:45 [name], [name] ([id number]) -------------------------------------------------------------------------------- Patient/Caregiver Education Details Patient Name: [name] Date of Service: 11/21/2021 1:15 PM Medical Record Number: [id number] Patient Account Number: [id number] Date of Birth/Gender: Jan 28, 1951 (71 y.o. M) Treating RN: Carlene Coria Primary Care Physician: Carley Hammed Other Clinician: Referring Physician: Carley Hammed Treating Physician/Extender: Skipper Cliche in Treatment: 1 Education Assessment Education Provided To: Patient Education Topics Provided Wound/Skin Impairment: Methods: Explain/Verbal Responses: State content correctly Electronic Signature(s) Signed: 11/25/2021 2:49:16 PM By: Carlene Coria RN Entered By: Carlene Coria on 11/21/2021 14:01:31 [name] ([id number]) -------------------------------------------------------------------------------- Wound Assessment Details Patient Name: [name] Date of Service: 11/21/2021 1:15 PM Medical Record Number: [id number] Patient Account Number: [id number] Date of Birth/Sex: 08-27-1950 (71 y.o. M) Treating RN: Carlene Coria Primary Care Hamda Klutts: Carley Hammed Other Clinician: Referring Menelik Mcfarren: Carley Hammed Treating Sydney Hasten/Extender: Skipper Cliche in Treatment: 1 Wound Status Wound Number: 1 Primary Venous Leg Ulcer Etiology: Wound Location: Right Lower Leg Wound Status: Open Wounding Event: Bite Comorbid Asthma, Chronic Obstructive Pulmonary Disease Date Acquired: 12/15/1991 History: (COPD), Hypertension Weeks Of Treatment: 1 Clustered Wound: No Photos Wound Measurements Length: (cm) 4.5 Width: (cm) 3.5 Depth: (cm) 0.1 Area: (cm) 12.37 Volume: (cm) 1.237 % Reduction in Area: 10% % Reduction in Volume: 10% Epithelialization: None Tunneling: No Undermining: No Wound Description Classification: Full Thickness Without Exposed Support Structu Exudate Amount: Medium Exudate Type: Serosanguineous Exudate Color: red, brown res Foul Odor After Cleansing: No Slough/Fibrino Yes Wound Bed Granulation Amount: Medium (34-66%) Exposed Structure Granulation Quality: Red Fascia Exposed: No Necrotic Amount: Medium (34-66%) Fat Layer (Subcutaneous Tissue) Exposed: Yes Necrotic Quality: Adherent  Slough Tendon Exposed: No Muscle Exposed: No Joint Exposed: No Bone Exposed: No Treatment Notes Wound #1 (Lower Leg) Wound Laterality: Right Cleanser [place]ram Ancillary Kit - 15 Day Supply Discharge Instruction: Use supplies as instructed; Kit contains: (15) Saline Bullets; (15) 3x3 Gauze; 15 pr Gloves Soap and Water Discharge Instruction: Gently cleanse wound with antibacterial soap, rinse and pat dry prior to dressing wounds MICHAH, MINTON ([id number]) Peri-Wound Care Topical Primary Dressing Silvercel Small 2x2 (in/in) Discharge Instruction: Apply Silvercel Small 2x2 (in/in) as instructed Secondary Dressing ABD Pad 5x9 (in/in) Discharge Instruction: Cover with ABD pad Gauze Discharge Instruction: As directed: dry, moistened with saline or moistened with Dakins Solution Secured With Compression Wrap Medichoice 4 layer Compression System, 35-40 mmHG Discharge Instruction: Apply multi-layer wrap as directed. Compression Stockings Add-Ons Electronic Signature(s) Signed: 11/25/2021 2:49:16 PM By: Carlene Coria RN Entered By: Carlene Coria on 11/21/2021 13:23:50 [name], [name] ([id number]) -------------------------------------------------------------------------------- [place] Details Patient Name: [name] Date of Service: 11/21/2021 1:15 PM Medical Record Number: [id number] Patient Account Number: [id number] Date of Birth/Sex: [birth year] (71 y.o. M) Treating RN: Carlene Coria Primary Care Krystian Ferrentino: Carley Hammed Other Clinician: Referring Akash Winski: Carley Hammed Treating Altheia Shafran/Extender: Skipper Cliche in Treatment: 1 Vital Signs Time Taken: 13:13 Temperature (F): 98.1 Height (in): 68 Pulse (bpm): 71 Weight (lbs): 340 Respiratory Rate (breaths/min): 16 Body Mass Index (BMI): 51.7 Blood Pressure (mmHg): 177/94 Reference Range: 80 - 120 mg / dl Electronic Signature(s) Signed: 11/25/2021 2:49:16 PM By: Carlene Coria RN Entered By: Carlene Coria  on 11/21/2021 13:14:36

## 2021-11-27 ENCOUNTER — Ambulatory Visit: Payer: Medicare Other | Admitting: Physician Assistant

## 2021-12-04 ENCOUNTER — Encounter: Payer: Medicare Other | Admitting: Physician Assistant

## 2021-12-04 DIAGNOSIS — I87331 Chronic venous hypertension (idiopathic) with ulcer and inflammation of right lower extremity: Secondary | ICD-10-CM | POA: Diagnosis not present

## 2021-12-04 NOTE — Progress Notes (Signed)
JAMARRIUS, SALAY ([id number]) Visit Report for 12/04/2021 Arrival Information Details Patient Name: [name], [name]. Date of Service: 12/04/2021 2:00 PM Medical Record Number: [id number] Patient Account Number: [id number] Date of Birth/Sex: 1950/06/26 (71 y.o. M) Treating RN: Cornell Barman Primary Care Camara Rosander: Carley Hammed Other Clinician: Referring Ansley Stanwood: Carley Hammed Treating Twana Wileman/Extender: Skipper Cliche in Treatment: 2 Visit Information History Since Last Visit Added or deleted any medications: No Patient Arrived: Walker Has Dressing in Place as Prescribed: Yes Arrival Time: 14:25 Has Compression in Place as Prescribed: Yes Accompanied By: self Pain Present Now: No Transfer Assistance: None Patient Identification Verified: Yes Secondary Verification Process Completed: Yes Patient Requires Transmission-Based Precautions: No Patient Has Alerts: No Electronic Signature(s) Unsigned Entered ByGretta Cool, BSN, RN, CWS, Kim on 12/04/2021 14:26:13 Signature(s): Date(s): [name] ([id number]) -------------------------------------------------------------------------------- Clinic Level of Care Assessment Details Patient Name: [name], [name]. Date of Service: 12/04/2021 2:00 PM Medical Record Number: [id number] Patient Account Number: [id number] Date of Birth/Sex: Feb 03, 1951 (71 y.o. M) Treating RN: Cornell Barman Primary Care Maverick Patman: Carley Hammed Other Clinician: Referring Aftin Lye: Carley Hammed Treating Parris Cudworth/Extender: Skipper Cliche in Treatment: 2 Clinic Level of Care Assessment Items TOOL 4 Quantity Score []  - Use when only an EandM is performed on FOLLOW-UP visit 0 ASSESSMENTS - Nursing Assessment / Reassessment X - Reassessment of Co-morbidities (includes updates in patient status) 1 10 X- 1 5 Reassessment of Adherence to Treatment Plan ASSESSMENTS - Wound and Skin Assessment / Reassessment X - Simple Wound Assessment /  Reassessment - one wound 1 5 []  - 0 Complex Wound Assessment / Reassessment - multiple wounds []  - 0 Dermatologic / Skin Assessment (not related to wound area) ASSESSMENTS - Focused Assessment []  - Circumferential Edema Measurements - multi extremities 0 []  - 0 Nutritional Assessment / Counseling / Intervention []  - 0 Lower Extremity Assessment (monofilament, tuning fork, pulses) []  - 0 Peripheral Arterial Disease Assessment (using hand held doppler) ASSESSMENTS - Ostomy and/or Continence Assessment and Care []  - Incontinence Assessment and Management 0 []  - 0 Ostomy Care Assessment and Management (repouching, etc.) PROCESS - Coordination of Care X - Simple Patient / Family Education for ongoing care 1 15 []  - 0 Complex (extensive) Patient / Family Education for ongoing care X- 1 10 Staff obtains Consents, Records, Test Results / Process Orders []  - 0 Staff telephones HHA, Nursing Homes / Clarify orders / etc []  - 0 Routine Transfer to another Facility (non-emergent condition) []  - 0 Routine Hospital Admission (non-emergent condition) []  - 0 New Admissions / Biomedical engineer / Ordering NPWT, Apligraf, etc. []  - 0 Emergency Hospital Admission (emergent condition) X- 1 10 Simple Discharge Coordination []  - 0 Complex (extensive) Discharge Coordination PROCESS - Special Needs []  - Pediatric / Minor Patient Management 0 []  - 0 Isolation Patient Management []  - 0 Hearing / Language / Visual special needs []  - 0 Assessment of Community assistance (transportation, D/C planning, etc.) []  - 0 Additional assistance / Altered mentation []  - 0 Support Surface(s) Assessment (bed, cushion, seat, etc.) INTERVENTIONS - Wound Cleansing / Measurement Offutt, Coren G. ([id number]) X- 1 5 Simple Wound Cleansing - one wound []  - 0 Complex Wound Cleansing - multiple wounds X- 1 5 Wound Imaging (photographs - any number of wounds) []  - 0 Wound Tracing (instead of  photographs) X- 1 5 Simple Wound Measurement - one wound []  - 0 Complex Wound Measurement - multiple wounds INTERVENTIONS - Wound Dressings []  - Small Wound Dressing one or multiple wounds 0 []  -  0 Medium Wound Dressing one or multiple wounds X- 1 20 Large Wound Dressing one or multiple wounds $RemoveBeforeD'[]'KbwSmslJdjFaLZ$  - 0 Application of Medications - topical $RemoveB'[]'dSCFRUlk$  - 0 Application of Medications - injection INTERVENTIONS - Miscellaneous $RemoveBeforeD'[]'fglZMRaesEhdND$  - External ear exam 0 $Remo'[]'AWOrx$  - 0 Specimen Collection (cultures, biopsies, blood, body fluids, etc.) $RemoveBefor'[]'LJwOOjfodNqE$  - 0 Specimen(s) / Culture(s) sent or taken to Lab for analysis $RemoveBefo'[]'qxfwRryeuaZ$  - 0 Patient Transfer (multiple staff / Civil Service fast streamer / Similar devices) $RemoveBeforeDE'[]'ZpjcOcHzbQKAyZr$  - 0 Simple Staple / Suture removal (25 or less) $Remove'[]'ZOYqSWX$  - 0 Complex Staple / Suture removal (26 or more) $Remove'[]'iIWZzkR$  - 0 Hypo / Hyperglycemic Management (close monitor of Blood Glucose) $RemoveBefore'[]'cPayzYGItSpNa$  - 0 Ankle / Brachial Index (ABI) - do not check if billed separately X- 1 5 Vital Signs Has the patient been seen at the hospital within the last three years: Yes Total Score: 95 Level Of Care: New/Established - Level 3 Electronic Signature(s) Unsigned Entered By: Gretta Cool, BSN, RN, CWS, Kim on 12/04/2021 15:12:53 Signature(s): Date(s): [name] ([id number]) -------------------------------------------------------------------------------- Encounter Discharge Information Details Patient Name: [name], [name]. Date of Service: 12/04/2021 2:00 PM Medical Record Number: [id number] Patient Account Number: [id number] Date of Birth/Sex: 1950/10/03 (71 y.o. M) Treating RN: Cornell Barman Primary Care Harkirat Orozco: Carley Hammed Other Clinician: Referring Chante Mayson: Carley Hammed Treating Lisette Mancebo/Extender: Skipper Cliche in Treatment: 2 Encounter Discharge Information Items Discharge Condition: Stable Ambulatory Status: Walker Discharge Destination: Home Transportation: Private Auto Schedule Follow-up Appointment: Yes Clinical Summary of  Care: Electronic Signature(s) Unsigned Entered By: Gretta Cool, BSN, RN, CWS, Kim on 12/04/2021 15:13:45 Signature(s): Date(s): [name] ([id number]) -------------------------------------------------------------------------------- General Visit Notes Details Patient Name: [name] Date of Service: 12/04/2021 2:00 PM Medical Record Number: [id number] Patient Account Number: [id number] Date of Birth/Sex: 15-Jul-1950 (71 y.o. M) Treating RN: Cornell Barman Primary Care Calena Salem: Carley Hammed Other Clinician: Referring Jezel Basto: Carley Hammed Treating Sybilla Malhotra/Extender: Skipper Cliche in Treatment: 2 Notes Patient has been in [facility] hospital related to his breathing for a week since his last wound care visit. Electronic Signature(s) Unsigned Entered By: Gretta Cool, BSN, RN, CWS, Kim on 12/04/2021 14:38:38 Signature(s): Date(s): [name] ([id number]) -------------------------------------------------------------------------------- Lower Extremity Assessment Details Patient Name: [name], [name]. Date of Service: 12/04/2021 2:00 PM Medical Record Number: [id number] Patient Account Number: [id number] Date of Birth/Sex: 1950/09/22 (71 y.o. M) Treating RN: Cornell Barman Primary Care Vernal Hritz: Carley Hammed Other Clinician: Referring Governor Matos: Carley Hammed Treating Kristi Hyer/Extender: Jeri Cos Weeks in Treatment: 2 Edema Assessment Assessed: [Left: No] [Right: No] [Left: Edema] [Right: :] Calf Left: Right: Point of Measurement: 37 cm From Medial Instep 51 cm Ankle Left: Right: Point of Measurement: 13 cm From Medial Instep 31 cm Vascular Assessment Pulses: Dorsalis Pedis Palpable: [Right:Yes] Electronic Signature(s) Unsigned Entered ByGretta Cool, BSN, RN, CWS, Kim on 12/04/2021 14:37:15 Signature(s): Date(s): [name] ([id number]) -------------------------------------------------------------------------------- Multi Wound Chart  Details Patient Name: [name], [name]. Date of Service: 12/04/2021 2:00 PM Medical Record Number: [id number] Patient Account Number: [id number] Date of Birth/Sex: Oct 16, 1950 (71 y.o. M) Treating RN: Cornell Barman Primary Care Reeanna Acri: Carley Hammed Other Clinician: Referring Phillis Thackeray: Carley Hammed Treating Merinda Victorino/Extender: Skipper Cliche in Treatment: 2 Vital Signs Height(in): 60 Pulse(bpm): 64 Weight(lbs): 340 Blood Pressure(mmHg): 130/84 Body Mass Index(BMI): 51.7 Temperature(F): 97.7 Respiratory Rate(breaths/min): 18 Photos: [1:No Photos] [N/A:N/A] Wound Location: [1:Right Lower Leg] [N/A:N/A] Wounding Event: [1:Bite] [N/A:N/A] Primary Etiology: [1:Venous Leg Ulcer] [N/A:N/A] Date Acquired: [1:12/15/1991] [N/A:N/A] Weeks of Treatment: [1:2] [N/A:N/A] Wound Status: [1:Open] [N/A:N/A] Wound Recurrence: [1:No] [N/A:N/A] Measurements L x  W x D (cm) [1:1.5x0.7x0.2] [N/A:N/A] Area (cm) : [1:0.825] [N/A:N/A] Volume (cm) : [1:0.165] [N/A:N/A] % Reduction in Area: [1:94.00%] [N/A:N/A] % Reduction in Volume: [1:88.00%] [N/A:N/A] Classification: [1:Full Thickness Without Exposed Support Structures] [N/A:N/A] Exudate Amount: [1:Medium] [N/A:N/A] Exudate Type: [1:Serosanguineous red, brown] [N/A:N/A N/A] Treatment Notes Electronic Signature(s) Unsigned Entered By: Gretta Cool, BSN, RN, CWS, Kim on 12/04/2021 14:59:35 Signature(s): Date(s): [name] ([id number]) -------------------------------------------------------------------------------- [place] Details Patient Name: [name], [name]. Date of Service: 12/04/2021 2:00 PM Medical Record Number: [id number] Patient Account Number: [id number] Date of Birth/Sex: 22-Sep-1950 (71 y.o. M) Treating RN: Cornell Barman Primary Care Eva Vallee: Carley Hammed Other Clinician: Referring Meira Wahba: Carley Hammed Treating Zilda No/Extender: Skipper Cliche in Treatment: 2 Active Inactive Wound/Skin  Impairment Nursing Diagnoses: Knowledge deficit related to ulceration/compromised skin integrity Goals: Patient/caregiver will verbalize understanding of skin care regimen Date Initiated: 11/14/2021 Target Resolution Date: 12/14/2021 Goal Status: Active Ulcer/skin breakdown will have a volume reduction of 30% by week 4 Date Initiated: 11/14/2021 Target Resolution Date: 12/14/2021 Goal Status: Active Ulcer/skin breakdown will have a volume reduction of 50% by week 8 Date Initiated: 11/14/2021 Target Resolution Date: 01/14/2022 Goal Status: Active Ulcer/skin breakdown will have a volume reduction of 80% by week 12 Date Initiated: 11/14/2021 Target Resolution Date: 02/14/2022 Goal Status: Active Ulcer/skin breakdown will heal within 14 weeks Date Initiated: 11/14/2021 Target Resolution Date: 03/16/2022 Goal Status: Active Interventions: Assess patient/caregiver ability to obtain necessary supplies Assess patient/caregiver ability to perform ulcer/skin care regimen upon admission and as needed Assess ulceration(s) every visit Notes: Electronic Signature(s) Unsigned Entered By: Gretta Cool, BSN, RN, CWS, Kim on 12/04/2021 14:59:16 Signature(s): Date(s): [name] ([id number]) -------------------------------------------------------------------------------- Pain Assessment Details Patient Name: [name], [name]. Date of Service: 12/04/2021 2:00 PM Medical Record Number: [id number] Patient Account Number: [id number] Date of Birth/Sex: 05-Feb-1951 (71 y.o. M) Treating RN: Cornell Barman Primary Care Suellen Durocher: Carley Hammed Other Clinician: Referring Jasmene Goswami: Carley Hammed Treating Lety Cullens/Extender: Skipper Cliche in Treatment: 2 Active Problems Location of Pain Severity and Description of Pain Patient Has Paino Yes Site Locations Pain Location: Generalized Pain Pain Management and Medication Current Pain Management: Notes patient denies pain in wounded area. Electronic  Signature(s) Unsigned Entered By: Gretta Cool, BSN, RN, CWS, Kim on 12/04/2021 14:28:54 Signature(s): Date(s): [name] ([id number]) -------------------------------------------------------------------------------- Patient/Caregiver Education Details Patient Name: [name], [name] Date of Service: 12/04/2021 2:00 PM Medical Record Number: [id number] Patient Account Number: [id number] Date of Birth/Gender: [birth year] (71 y.o. M) Treating RN: Cornell Barman Primary Care Physician: Carley Hammed Other Clinician: Referring Physician: Carley Hammed Treating Physician/Extender: Skipper Cliche in Treatment: 2 Education Assessment Education Provided To: Patient Education Topics Provided Venous: Handouts: Controlling Swelling with Multilayered Compression Wraps Methods: Demonstration, Explain/Verbal Responses: State content correctly Electronic Signature(s) Unsigned Entered By: Gretta Cool, BSN, RN, CWS, Kim on 12/04/2021 15:13:08 Signature(s): Date(s): [name] ([id number]) -------------------------------------------------------------------------------- Wound Assessment Details Patient Name: [name], [name]. Date of Service: 12/04/2021 2:00 PM Medical Record Number: [id number] Patient Account Number: [id number] Date of Birth/Sex: 10/01/1950 (71 y.o. M) Treating RN: Cornell Barman Primary Care Maydelin Deming: Carley Hammed Other Clinician: Referring Waymon Laser: Carley Hammed Treating Jack Bolio/Extender: Jeri Cos Weeks in Treatment: 2 Wound Status Wound Number: 1 Primary Etiology: Venous Leg Ulcer Wound Location: Right Lower Leg Wound Status: Open Wounding Event: Bite Date Acquired: 12/15/1991 Weeks Of Treatment: 2 Clustered Wound: No Wound Measurements Length: (cm) 1.5 Width: (cm) 0.7 Depth: (cm) 0.2 Area: (cm) 0.825 Volume: (cm) 0.165 % Reduction in Area: 94% % Reduction in Volume: 88% Wound Description Classification:  Full Thickness Without Exposed  Support Structu Exudate Amount: Medium Exudate Type: Serosanguineous Exudate Color: red, brown res Treatment Notes Wound #1 (Lower Leg) Wound Laterality: Right Cleanser [place] Ancillary Kit - 15 Day Supply Discharge Instruction: Use supplies as instructed; Kit contains: (15) Saline Bullets; (15) 3x3 Gauze; 15 pr Gloves Soap and Water Discharge Instruction: Gently cleanse wound with antibacterial soap, rinse and pat dry prior to dressing wounds Peri-Wound Care Topical Primary Dressing Silvercel Small 2x2 (in/in) Discharge Instruction: Apply Silvercel Small 2x2 (in/in) as instructed Secondary Dressing ABD Pad 5x9 (in/in) Discharge Instruction: Cover with ABD pad Secured With Coban Cohesive Bandage 4x5 (yds) Stretched Discharge Instruction: Apply Coban as directed. Kerlix Roll Sterile or Non-Sterile 6-ply 4.5x4 (yd/yd) Discharge Instruction: Apply Kerlix as directed Compression Wrap Compression Stockings Add-Ons MICHIAH, MUDRY ([id number]) Electronic Signature(s) Unsigned Entered By: Gretta Cool, BSN, RN, CWS, Kim on 12/04/2021 14:33:29 Signature(s): Date(s): [name] ([id number]) -------------------------------------------------------------------------------- [place] Details Patient Name: [name] Date of Service: 12/04/2021 2:00 PM Medical Record Number: [id number] Patient Account Number: [id number] Date of Birth/Sex: 1950/07/06 (71 y.o. M) Treating RN: Cornell Barman Primary Care Zowie Lundahl: Carley Hammed Other Clinician: Referring Coreon Simkins: Carley Hammed Treating Moataz Tavis/Extender: Skipper Cliche in Treatment: 2 Vital Signs Time Taken: 14:28 Temperature (F): 97.7 Height (in): 68 Pulse (bpm): 82 Weight (lbs): 340 Respiratory Rate (breaths/min): 18 Body Mass Index (BMI): 51.7 Blood Pressure (mmHg): 130/84 Reference Range: 80 - 120 mg / dl Electronic Signature(s) Unsigned Entered By: Gretta Cool, BSN, RN, CWS, Kim on 12/04/2021  14:28:36 Signature(s): Date(s):

## 2021-12-04 NOTE — Progress Notes (Signed)
DEVONN, GIAMPIETRO ([id number]) Visit Report for 12/04/2021 Chief Complaint Document Details Patient Name: [name], [name]. Date of Service: 12/04/2021 2:00 PM Medical Record Number: [id number] Patient Account Number: [id number] Date of Birth/Sex: 21-Oct-1950 (71 y.o. 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 M) Treating RN: Alycia Rossetti Primary Care Provider: Carley Hammed Other Clinician: Referring Provider: Carley Hammed Treating Provider/Extender: Skipper Cliche in Treatment: 2 History of Present Illness HPI Description: 11-14-2021 upon evaluation today patient presents for initial evaluation here in our clinic concerning issues he is having with his right lower extremity he does have a wound on the posterior aspect. This has been present intermittently honestly since 1993. 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 There is a lot of scar tissue which I think is probably the reason why this reopens specifically in regard to the fact that he does have chronic venous insufficiency and lymphedema in his extremities. This means anytime his legs swell this is good to be the most likely area to  weep. Right now its been about 3 weeks they have been trying to get this closed at this time and to be honest generally it closes much more quickly than that. That is the reason he has been seen today to see if we can get this closed. Has been on 3 rounds of antibiotics without benefit. On 10-01-2021 the patient did have stents placed bilaterally in the lower extremities and ablation on the left side done. This is outside of our visible EMR system I was not able to confirm this independently but his arterial flow and ABIs in the clinic today was 1.33 on the right. He also was started on Augmentin on May 15 for 10-day course. That is done at this point. Otherwise he has a history of hypertension and COPD. 11-21-2021 upon evaluation today patient appears to be doing well with regard to his wound. In fact this is showing signs of good improvement. It still slow to heal but nonetheless I think we are seeing improvements in size and overall appearance of the wound bed and general. I am very pleased in that regard. 12-04-2021 upon evaluation today patient appears to be doing well currently in regard to his wound and this is actually showing signs of significant improvement which is great news. He has been in the hospital and therefore we have not seen him for a few weeks but nonetheless in the interim his wound has dramatically gotten better which is great news. I do not see any evidence of infection right now. Electronic Signature(s) Signed: 12/04/2021 4:55:06 PM By: Worthy Keeler PA-C Entered By: Worthy Keeler on 12/04/2021 16:55:06 Loud, Elliot Dally ([id number]) -------------------------------------------------------------------------------- Physical Exam Details Patient Name: [name] Date of Service: 12/04/2021 2:00 PM Medical Record Number: [id number] Patient Account Number: [id number] Date of Birth/Sex: 12-Apr-1951 (71 y.o. M) Treating RN: Alycia Rossetti Primary Care Provider:  Carley Hammed Other Clinician: Referring Provider: Carley Hammed Treating Provider/Extender: Jeri Cos Weeks in Treatment: 2 Constitutional Well-nourished and well-hydrated in no acute distress. Respiratory normal breathing without difficulty. Psychiatric this patient is able to make decisions and demonstrates good insight into disease process. Alert and Oriented x 3. pleasant and cooperative. Notes Upon inspection patient's wound bed showed signs of good granulation and epithelization at this point. Fortunately I do not see any signs of infection locally or systemically which is great news and overall I think you are on the right track here. Electronic Signature(s) Signed: 12/04/2021 4:55:24 PM By: Worthy Keeler PA-C Entered By: Worthy Keeler on 12/04/2021 16:55:24 Rentfrow, Elliot Dally ([id number]) -------------------------------------------------------------------------------- Physician Orders Details Patient Name: [name] Date of Service: 12/04/2021 2:00 PM Medical Record Number: [id number] Patient Account Number: [id number] Date of Birth/Sex: Jun 11, 1951 (71 y.o. M) Treating RN: Cornell Barman Primary Care Provider: Carley Hammed Other Clinician: Referring Provider: Carley Hammed Treating Provider/Extender: Skipper Cliche in Treatment: 2 Verbal / Phone Orders: No Diagnosis Coding ICD-10 Coding Code Description [id number] Chronic venous hypertension (idiopathic) with ulcer and inflammation of right lower extremity I89.0 Lymphedema, not elsewhere classified L97.812 Non-pressure chronic ulcer of other part of right lower leg with fat layer exposed I10 Essential (primary) hypertension J44.9 Chronic obstructive pulmonary disease, unspecified Follow-up Appointments o Return Appointment in 1 week. Bathing/ Shower/ Hygiene o May shower with wound dressing protected with water repellent cover or cast protector. Edema Control - Lymphedema / Segmental Compressive  Device / Other o Elevate, Exercise Daily and Avoid Standing for Long Periods of Time. o Elevate legs to the level of the heart and pump ankles as often as possible o Elevate leg(s) parallel to the floor when sitting. Wound Treatment Wound #1 - Lower Leg Wound Laterality: Right Cleanser: Byram Ancillary Kit - 15 Day Supply 1 x Per Week/30 Days Discharge Instructions: Use supplies as instructed; Kit contains: (15) Saline Bullets; (15) 3x3 Gauze; 15 pr Gloves Cleanser: Soap and Water 1 x Per Week/30 Days Discharge Instructions: Gently cleanse wound with antibacterial soap, rinse and pat dry prior to dressing wounds Primary Dressing: Silvercel Small 2x2 (in/in) 1 x Per Week/30 Days Discharge Instructions: Apply Silvercel Small 2x2 (in/in) as instructed Secondary Dressing: ABD Pad 5x9 (in/in) 1 x Per Week/30 Days Discharge Instructions: Cover with ABD pad Secondary Dressing: Gauze 1 x Per Week/30 Days Discharge Instructions: As directed: dry, moistened with saline or moistened with Dakins Solution Compression Wrap: Medichoice 4 layer Compression System, 35-40 mmHG 1 x Per Week/30 Days Discharge Instructions: Apply multi-layer wrap as directed. Electronic Signature(s) Signed: 12/04/2021 5:14:12 PM By: Worthy Keeler PA-C Entered By: Gretta Cool, BSN, RN, CWS, Kim on 12/04/2021 15:11:50 [name] ([id number]) -------------------------------------------------------------------------------- Problem List Details Patient Name: ARAS, ALBARRAN Date of Service: 12/04/2021 2:00 PM Medical Record Number: [id number] Patient Account Number: [id number] Date of Birth/Sex: [birth year] (71 y.o. M) Treating RN: Alycia Rossetti Primary Care Provider: Carley Hammed Other Clinician: Referring Provider: Carley Hammed Treating Provider/Extender: Skipper Cliche in Treatment: 2 Active Problems ICD-10 Encounter Code Description Active Date MDM Diagnosis I87.331 Chronic venous hypertension  (idiopathic) with ulcer and inflammation of 11/14/2021 No Yes right lower extremity I89.0 Lymphedema, not elsewhere classified 11/14/2021 No Yes L97.812 Non-pressure chronic ulcer of other part of right lower leg with fat layer 11/14/2021 No Yes exposed [place] (primary) hypertension 11/14/2021 No Yes J44.9 Chronic obstructive pulmonary disease, unspecified 11/14/2021 No Yes Inactive Problems Resolved Problems Electronic Signature(s) Signed: 12/04/2021 2:16:45 PM  By: Worthy Keeler PA-C Entered By: Worthy Keeler on 12/04/2021 14:16:44 Canizalez, Elliot Dally ([id number]) -------------------------------------------------------------------------------- Progress Note Details Patient Name: TYION, BOYLEN Date of Service: 12/04/2021 2:00 PM Medical Record Number: [id number] Patient Account Number: [id number] Date of Birth/Sex: 1950/11/24 (71 y.o. M) Treating RN: Alycia Rossetti Primary Care Provider: Carley Hammed Other Clinician: Referring Provider: Carley Hammed Treating Provider/Extender: Skipper Cliche in Treatment: 2 Subjective Chief Complaint Information obtained from Patient Right leg ulcer with bilateral venous stasis History of Present Illness (HPI) 11-14-2021 upon evaluation today patient presents for initial evaluation here in our clinic concerning issues he is having with his right lower extremity he does have a wound on the posterior aspect. This has been present intermittently honestly since 1993. 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 There is a lot of scar tissue which I think is probably the reason why this reopens specifically in regard to the fact that he does have chronic venous insufficiency and lymphedema in his extremities. This means anytime his legs swell this is good to be the most likely area to weep. Right now its been about 3  weeks they have been trying to get this closed at this time and to be honest generally it closes much more quickly than that. That is the reason he has been seen today to see if we can get this closed. Has been on 3 rounds of antibiotics without benefit. On 10-01-2021 the patient did have stents placed bilaterally in the lower extremities and ablation on the left side done. This is outside of our visible EMR system I was not able to confirm this independently but his arterial flow and ABIs in the clinic today was 1.33 on the right. He also was started on Augmentin on May 15 for 10-day course. That is done at this point. Otherwise he has a history of hypertension and COPD. 11-21-2021 upon evaluation today patient appears to be doing well with regard to his wound. In fact this is showing signs of good improvement. It still slow to heal but nonetheless I think we are seeing improvements in size and overall appearance of the wound bed and general. I am very pleased in that regard. 12-04-2021 upon evaluation today patient appears to be doing well currently in regard to his wound and this is actually showing signs of significant improvement which is great news. He has been in the hospital and therefore we have not seen him for a few weeks but nonetheless in the interim his wound has dramatically gotten better which is great news. I do not see any evidence of infection right now. Objective Constitutional Well-nourished and well-hydrated in no acute distress. Vitals Time Taken: 2:28 PM, Height: 68 in, Weight: 340 lbs, BMI: 51.7, Temperature: 97.7 F, Pulse: 82 bpm, Respiratory Rate: 18 breaths/min, Blood Pressure: 130/84 mmHg. Respiratory normal breathing without difficulty. Psychiatric this patient is able to make decisions and demonstrates good insight into disease process. Alert and Oriented x 3. pleasant and cooperative. General Notes: Upon inspection patient's wound bed showed signs of good granulation  and epithelization at this point. Fortunately I do not see any signs of infection locally or systemically which is great news and overall I think you are on the right track here. Integumentary (Hair, Skin) Wound #1 status is Open. Original cause of wound was Bite. The date acquired  was: 12/15/1991. The wound has been in treatment 2 weeks. The wound is located on the Right Lower Leg. The wound measures 1.5cm length x 0.7cm width x 0.2cm depth; 0.825cm^2 area and 0.165cm^3 volume. There is a medium amount of serosanguineous drainage noted. Assessment DARYAN, BUELL ([id number]) Active Problems ICD-10 Chronic venous hypertension (idiopathic) with ulcer and inflammation of right lower extremity Lymphedema, not elsewhere classified Non-pressure chronic ulcer of other part of right lower leg with fat layer exposed Essential (primary) hypertension Chronic obstructive pulmonary disease, unspecified Plan Follow-up Appointments: Return Appointment in 1 week. Bathing/ Shower/ Hygiene: May shower with wound dressing protected with water repellent cover or cast protector. Edema Control - Lymphedema / Segmental Compressive Device / Other: Elevate, Exercise Daily and Avoid Standing for Long Periods of Time. Elevate legs to the level of the heart and pump ankles as often as possible Elevate leg(s) parallel to the floor when sitting. WOUND #1: - Lower Leg Wound Laterality: Right Cleanser: Byram Ancillary Kit - 15 Day Supply 1 x Per Week/30 Days Discharge Instructions: Use supplies as instructed; Kit contains: (15) Saline Bullets; (15) 3x3 Gauze; 15 pr Gloves Cleanser: Soap and Water 1 x Per Week/30 Days Discharge Instructions: Gently cleanse wound with antibacterial soap, rinse and pat dry prior to dressing wounds Primary Dressing: Silvercel Small 2x2 (in/in) 1 x Per Week/30 Days Discharge Instructions: Apply Silvercel Small 2x2 (in/in) as instructed Secondary Dressing: ABD Pad 5x9 (in/in) 1 x Per  Week/30 Days Discharge Instructions: Cover with ABD pad Secondary Dressing: Gauze 1 x Per Week/30 Days Discharge Instructions: As directed: dry, moistened with saline or moistened with Dakins Solution Compression Wrap: Medichoice 4 layer Compression System, 35-40 mmHG 1 x Per Week/30 Days Discharge Instructions: Apply multi-layer wrap as directed. 1. I am going to suggest that we go ahead and continue with the wound care measures as before and the patient is in agreement with the plan. This includes the use of the silver alginate dressing which I think is still doing a good job here. 2. Also do recommend that we have the patient continue with the dressing changes weekly with the compression wrap. We are going to also continue with the 4-layer compression wrap. We will see patient back for reevaluation in 1 week here in the clinic. If anything worsens or changes patient will contact our office for additional recommendations. Electronic Signature(s) Signed: 12/04/2021 4:55:54 PM By: Worthy Keeler PA-C Entered By: Worthy Keeler on 12/04/2021 16:55:54 Mato, Elliot Dally ([id number]) -------------------------------------------------------------------------------- SuperBill Details Patient Name: [name] Date of Service: 12/04/2021 Medical Record Number: [id number] Patient Account Number: [id number] Date of Birth/Sex: 1950/08/01 (71 y.o. M) Treating RN: Alycia Rossetti Primary Care Provider: Carley Hammed Other Clinician: Referring Provider: Carley Hammed Treating Provider/Extender: Skipper Cliche in Treatment: 2 Diagnosis Coding ICD-10 Codes Code Description [phone] Chronic venous hypertension (idiopathic) with ulcer and inflammation of right lower extremity I89.0 Lymphedema, not elsewhere classified L97.812 Non-pressure chronic ulcer of other part of right lower leg with fat layer exposed I10 Essential (primary) hypertension J44.9 Chronic obstructive pulmonary disease,  unspecified Facility Procedures CPT4 Code: [id number] Description: 99213 - WOUND CARE VISIT-LEV 3 EST PT Modifier: Quantity: 1 Physician Procedures CPT4 Code Description: [id number] - WC PHYS LEVEL 3 - EST PT Modifier: Quantity: 1 CPT4 Code Description: ICD-10 Diagnosis Description I87.331 Chronic venous hypertension (idiopathic) with ulcer and inflammation of I89.0 Lymphedema, not elsewhere classified L97.812 Non-pressure chronic ulcer of other part of right lower leg with fat la  I10  Essential (primary) hypertension Modifier: right lower extremit yer exposed Quantity: y Engineer, maintenance) Signed: 12/04/2021 4:59:51 PM By: Worthy Keeler PA-C Entered By: Worthy Keeler on 12/04/2021 16:59:51

## 2021-12-11 ENCOUNTER — Encounter: Payer: Medicare Other | Admitting: Physician Assistant

## 2021-12-11 DIAGNOSIS — I87331 Chronic venous hypertension (idiopathic) with ulcer and inflammation of right lower extremity: Secondary | ICD-10-CM | POA: Diagnosis not present

## 2021-12-11 NOTE — Progress Notes (Addendum)
DAIVD, FREDERICKSEN ([id number]) Visit Report for 12/11/2021 Chief Complaint Document Details Patient Name: [name], [name]. Date of Service: 12/11/2021 1:15 PM Medical Record Number: [id number] Patient Account Number: [id number] Date of Birth/Sex: April 04, 1951 (71 y.o. 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 M) Treating RN: Carlene Coria Primary Care Provider: Carley Hammed Other Clinician: Referring Provider: Carley Hammed Treating Provider/Extender: Skipper Cliche in Treatment: 3 History of Present Illness HPI Description: 11-14-2021 upon evaluation today patient presents for initial evaluation here in our clinic concerning issues he is having with his right lower extremity he does have a wound on the posterior aspect. This has been present intermittently honestly since 1993. 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 There is a lot of scar tissue which I think is probably the reason why this reopens specifically in regard to the fact that he does have chronic venous insufficiency and lymphedema in his extremities. This means anytime his legs swell this is good to be the most likely area to  weep. Right now its been about 3 weeks they have been trying to get this closed at this time and to be honest generally it closes much more quickly than that. That is the reason he has been seen today to see if we can get this closed. Has been on 3 rounds of antibiotics without benefit. On 10-01-2021 the patient did have stents placed bilaterally in the lower extremities and ablation on the left side done. This is outside of our visible EMR system I was not able to confirm this independently but his arterial flow and ABIs in the clinic today was 1.33 on the right. He also was started on Augmentin on May 15 for 10-day course. That is done at this point. Otherwise he has a history of hypertension and COPD. 11-21-2021 upon evaluation today patient appears to be doing well with regard to his wound. In fact this is showing signs of good improvement. It still slow to heal but nonetheless I think we are seeing improvements in size and overall appearance of the wound bed and general. I am very pleased in that regard. 12-04-2021 upon evaluation today patient appears to be doing well currently in regard to his wound and this is actually showing signs of significant improvement which is great news. He has been in the hospital and therefore we have not seen him for a few weeks but nonetheless in the interim his wound has dramatically gotten better which is great news. I do not see any evidence of infection right now. 12-11-2021 upon evaluation today patient appears to be doing well currently in regard to the wound on his leg. He has been tolerating the dressing changes without complication. Fortunately there does not appear to be any signs of active infection locally or systemically at this point which is great news. No fevers, chills, nausea, vomiting, or diarrhea. Electronic Signature(s) Signed:  12/11/2021 1:45:35 PM By: Worthy Keeler PA-C Entered By: Worthy Keeler on 12/11/2021 13:45:35 Cifuentes, Elliot Dally  ([id number]) -------------------------------------------------------------------------------- Physical Exam Details Patient Name: [name] Date of Service: 12/11/2021 1:15 PM Medical Record Number: [id number] Patient Account Number: [id number] Date of Birth/Sex: 05/24/1951 (71 y.o. M) Treating RN: Carlene Coria Primary Care Provider: Carley Hammed Other Clinician: Referring Provider: Carley Hammed Treating Provider/Extender: Skipper Cliche in Treatment: 3 Constitutional Well-nourished and well-hydrated in no acute distress. Respiratory normal breathing without difficulty. Psychiatric this patient is able to make decisions and demonstrates good insight into disease process. Alert and Oriented x 3. pleasant and cooperative. Notes Upon inspection patient's wound bed actually showed signs of good granulation and epithelization at this point. Fortunately there does not appear to be any evidence of infection locally or systemically which is great news and overall I am extremely pleased with where we stand currently. Electronic Signature(s) Signed: 12/11/2021 1:45:52 PM By: Worthy Keeler PA-C Entered By: Worthy Keeler on 12/11/2021 13:45:52 Bayron, Elliot Dally ([id number]) -------------------------------------------------------------------------------- Physician Orders Details Patient Name: [name] Date of Service: 12/11/2021 1:15 PM Medical Record Number: [id number] Patient Account Number: [id number] Date of Birth/Sex: 1951-02-07 (71 y.o. M) Treating RN: Carlene Coria Primary Care Provider: Carley Hammed Other Clinician: Referring Provider: Carley Hammed Treating Provider/Extender: Skipper Cliche in Treatment: 3 Verbal / Phone Orders: No Diagnosis Coding ICD-10 Coding Code Description [phone] Chronic venous hypertension (idiopathic) with ulcer and inflammation of right lower extremity I89.0 Lymphedema, not elsewhere classified L97.812  Non-pressure chronic ulcer of other part of right lower leg with fat layer exposed I10 Essential (primary) hypertension J44.9 Chronic obstructive pulmonary disease, unspecified Follow-up Appointments o Return Appointment in 1 week. Bathing/ Shower/ Hygiene o May shower with wound dressing protected with water repellent cover or cast protector. Edema Control - Lymphedema / Segmental Compressive Device / Other o Elevate, Exercise Daily and Avoid Standing for Long Periods of Time. o Elevate legs to the level of the heart and pump ankles as often as possible o Elevate leg(s) parallel to the floor when sitting. Wound Treatment Wound #1 - Lower Leg Wound Laterality: Right Cleanser: Byram Ancillary Kit - 15 Day Supply 1 x Per Week/30 Days Discharge Instructions: Use supplies as instructed; Kit contains: (15) Saline Bullets; (15) 3x3 Gauze; 15 pr Gloves Cleanser: Soap and Water 1 x Per Week/30 Days Discharge Instructions: Gently cleanse wound with antibacterial soap, rinse and pat dry prior to dressing wounds Primary Dressing: Silvercel Small 2x2 (in/in) 1 x Per Week/30 Days Discharge Instructions: Apply Silvercel Small 2x2 (in/in) as instructed Secondary Dressing: ABD Pad 5x9 (in/in) 1 x Per Week/30 Days Discharge Instructions: Cover with ABD pad Secondary Dressing: Gauze 1 x Per Week/30 Days Discharge Instructions: As directed: dry, moistened with saline or moistened with Dakins Solution Compression Wrap: Medichoice 4 layer Compression System, 35-40 mmHG 1 x Per Week/30 Days Discharge Instructions: Apply multi-layer wrap as directed. Compression Stockings: Circaid Juxta Lite Compression Wrap (DME) Right Leg Compression Amount: 20-30 mmHG Discharge Instructions: Apply Circaid Juxta Lite Compression Wrap as directed Electronic Signature(s) Signed: 12/12/2021 10:10:35 AM By: Carlene Coria RN Signed: 12/12/2021 4:59:17 PM By: Worthy Keeler PA-C Previous Signature: 12/11/2021 1:28:41 PM  Version By: Carlene Coria RN Entered By: Carlene Coria on 12/11/2021 13:52:21 Sandlin, Elliot Dally ([id number]) -------------------------------------------------------------------------------- Problem List Details Patient Name: TENNES[place], PERRA Date of Service: 12/11/2021 1:15 PM Medical Record Number: [id number] Patient Account Number: [id number] Date of Birth/Sex: 1951-02-18 (71 y.o. M) Treating RN:  Carlene Coria Primary Care Provider: Carley Hammed Other Clinician: Referring Provider: Carley Hammed Treating Provider/Extender: Skipper Cliche in Treatment: 3 Active Problems ICD-10 Encounter Code Description Active Date MDM Diagnosis I87.331 Chronic venous hypertension (idiopathic) with ulcer and inflammation of 11/14/2021 No Yes right lower extremity I89.0 Lymphedema, not elsewhere classified 11/14/2021 No Yes L97.812 Non-pressure chronic ulcer of other part of right lower leg with fat layer 11/14/2021 No Yes exposed [place] (primary) hypertension 11/14/2021 No Yes J44.9 Chronic obstructive pulmonary disease, unspecified 11/14/2021 No Yes Inactive Problems Resolved Problems Electronic Signature(s) Signed: 12/11/2021 1:09:47 PM By: Worthy Keeler PA-C Entered By: Worthy Keeler on 12/11/2021 13:09:46 Isip, Elliot Dally ([id number]) -------------------------------------------------------------------------------- Progress Note Details Patient Name: [name] Date of Service: 12/11/2021 1:15 PM Medical Record Number: [id number] Patient Account Number: [id number] Date of Birth/Sex: Sep 20, 1950 (72 y.o. 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 This has been present intermittently honestly since 1993. 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 There is a lot of scar tissue which I think is probably the reason why this reopens specifically in regard to the fact that he does have chronic venous insufficiency and lymphedema in his extremities. This means anytime his legs swell this is good to be the most likely area to weep. Right now its been about 3 weeks they have been trying to get this closed at this time and to be honest generally it closes much more quickly than that. That is the reason he has been seen today to see if we can get this closed. Has been on 3 rounds of antibiotics without benefit. On 10-01-2021 the patient did have stents placed bilaterally in the lower extremities and ablation on the left side done. This is outside of our visible EMR system I was not able to confirm this independently but his arterial flow and ABIs in the clinic today was 1.33 on the right. He also was started on Augmentin on May 15 for 10-day course. That is done at this point. Otherwise he has a history of hypertension and COPD. 11-21-2021 upon evaluation today patient appears to be doing well with regard to his wound. In fact this is showing signs of good improvement. It still slow to heal but nonetheless I think we are seeing improvements in size and overall appearance of the wound bed and general. I am very pleased in that regard. 12-04-2021 upon evaluation today patient appears to be doing well currently in regard to his wound and this is actually showing signs of significant improvement which is great news. He has been in the hospital and therefore we have not seen him  for a few weeks but nonetheless in the interim his wound has dramatically gotten better which is great news. I do not see any evidence of infection right now. 12-11-2021 upon evaluation today patient appears to be doing well currently in regard to the wound on his leg. He has been tolerating the dressing changes  without complication. Fortunately there does not appear to be any signs of active infection locally or systemically at this point which is great news. No fevers, chills, nausea, vomiting, or diarrhea. Objective Constitutional Well-nourished and well-hydrated in no acute distress. Vitals Time Taken: 1:09 PM, Height: 68 in, Weight: 340 lbs, BMI: 51.7, Temperature: 98.2 F, Pulse: 64 bpm, Respiratory Rate: 18 breaths/min, Blood Pressure: 155/82 mmHg. Respiratory normal breathing without difficulty. Psychiatric this patient is able to make decisions and demonstrates good insight into disease process. Alert and Oriented x 3. pleasant and cooperative. General Notes: Upon inspection patient's wound bed actually showed signs of good granulation and epithelization at this point. Fortunately there does not appear to be any evidence of infection locally or systemically which is great news and overall I am extremely pleased with where we stand currently. Integumentary (Hair, Skin) Wound #1 status is Open. Original cause of wound was Bite. The date acquired was: 12/15/1991. The wound has been in treatment 3 weeks. The wound is located on the Right Lower Leg. The wound measures 1cm length x 0.5cm width x 0.2cm depth; 0.393cm^2 area and 0.079cm^3 volume. There is Fat Layer (Subcutaneous Tissue) exposed. There is no tunneling or undermining noted. There is a medium amount of serosanguineous drainage noted. There is large (67-100%) red granulation within the wound bed. KOUA, DEEG ([id number]) Assessment Active Problems ICD-10 Chronic venous hypertension (idiopathic) with ulcer and  inflammation of right lower extremity Lymphedema, not elsewhere classified Non-pressure chronic ulcer of other part of right lower leg with fat layer exposed Essential (primary) hypertension Chronic obstructive pulmonary disease, unspecified Procedures Wound #1 Pre-procedure diagnosis of Wound #1 is a Venous Leg Ulcer located on the Right Lower Leg . There was a Four Layer Compression Therapy Procedure by Carlene Coria, RN. Post procedure Diagnosis Wound #1: Same as Pre-Procedure Plan Follow-up Appointments: Return Appointment in 1 week. Bathing/ Shower/ Hygiene: May shower with wound dressing protected with water repellent cover or cast protector. Edema Control - Lymphedema / Segmental Compressive Device / Other: Elevate, Exercise Daily and Avoid Standing for Long Periods of Time. Elevate legs to the level of the heart and pump ankles as often as possible Elevate leg(s) parallel to the floor when sitting. WOUND #1: - Lower Leg Wound Laterality: Right Cleanser: Byram Ancillary Kit - 15 Day Supply 1 x Per Week/30 Days Discharge Instructions: Use supplies as instructed; Kit contains: (15) Saline Bullets; (15) 3x3 Gauze; 15 pr Gloves Cleanser: Soap and Water 1 x Per Week/30 Days Discharge Instructions: Gently cleanse wound with antibacterial soap, rinse and pat dry prior to dressing wounds Primary Dressing: Silvercel Small 2x2 (in/in) 1 x Per Week/30 Days Discharge Instructions: Apply Silvercel Small 2x2 (in/in) as instructed Secondary Dressing: ABD Pad 5x9 (in/in) 1 x Per Week/30 Days Discharge Instructions: Cover with ABD pad Secondary Dressing: Gauze 1 x Per Week/30 Days Discharge Instructions: As directed: dry, moistened with saline or moistened with Dakins Solution Compression Wrap: Medichoice 4 layer Compression System, 35-40 mmHG 1 x Per Week/30 Days Discharge Instructions: Apply multi-layer wrap as directed. Compression Stockings: Circaid Juxta Lite Compression Wrap  (DME) Compression Amount: 20-30 mmHg (right) Discharge Instructions: Apply Circaid Juxta Lite Compression Wrap as directed 1. I am going to recommend that we go ahead and continue with the wound care measures as before and the patient is in agreement with the plan. This includes the use of the 4-layer compression wrap which I feel like is doing quite well. 2. I am also can recommend that  we continue with the ABD pad to cover as well as the silver alginate dressing which is doing excellent. 3. We are also going to go ahead and order him a juxta lite compression wraps he will have this once he heals he was appreciative of this and I do believe this is going to do quite well. We will see patient back for reevaluation in 1 week here in the clinic. If anything worsens or changes patient will contact our office for additional recommendations. Electronic Signature(s) Signed: 12/11/2021 1:46:34 PM By: Worthy Keeler PA-C Entered By: Worthy Keeler on 12/11/2021 13:46:33 [address] [place] ([id number]) [address] Elliot Dally ([id number]) -------------------------------------------------------------------------------- [place] Details Patient Name: [name] Date of Service: 12/11/2021 Medical Record Number: [id number] Patient Account Number: [id number] Date of Birth/Sex: 1951/03/02 (71 y.o. M) Treating RN: Carlene Coria Primary Care Provider: Carley Hammed Other Clinician: Referring Provider: Carley Hammed Treating Provider/Extender: Skipper Cliche in Treatment: 3 Diagnosis Coding ICD-10 Codes Code Description [phone] Chronic venous hypertension (idiopathic) with ulcer and inflammation of right lower extremity I89.0 Lymphedema, not elsewhere classified L97.812 Non-pressure chronic ulcer of other part of right lower leg with fat layer exposed I10 Essential (primary) hypertension J44.9 Chronic obstructive pulmonary disease, unspecified Facility Procedures CPT4 Code:  [id number] Description: (Facility Use Only) [phone] - [place] LWR RT LEG Modifier: Quantity: 1 Physician Procedures CPT4 Code Description: [id number] - WC PHYS LEVEL 3 - EST PT Modifier: Quantity: 1 CPT4 Code Description: ICD-10 Diagnosis Description I87.331 Chronic venous hypertension (idiopathic) with ulcer and inflammation of I89.0 Lymphedema, not elsewhere classified L97.812 Non-pressure chronic ulcer of other part of right lower leg with fat la  I10 Essential (primary) hypertension Modifier: right lower extremit yer exposed Quantity: y Engineer, maintenance) Signed: 12/11/2021 1:46:46 PM By: Worthy Keeler PA-C Entered By: Worthy Keeler on 12/11/2021 13:46:46

## 2021-12-11 NOTE — Progress Notes (Addendum)
TYLOR, COURTWRIGHT ([id number]) Visit Report for 12/11/2021 Arrival Information Details Patient Name: [name], [name]. Date of Service: 12/11/2021 1:15 PM Medical Record Number: [id number] Patient Account Number: [id number] Date of Birth/Sex: [birth year] (71 y.o. M) Treating RN: Carlene Coria Primary Care Abishai Viegas: Carley Hammed Other Clinician: Referring Adael Culbreath: Carley Hammed Treating Timouthy Gilardi/Extender: Skipper Cliche in Treatment: 3 Visit Information History Since Last Visit All ordered tests and consults were completed: No Patient Arrived: Ambulatory Added or deleted any medications: No Arrival Time: 13:05 Any new allergies or adverse reactions: No Accompanied By: self Had a fall or experienced change in No Transfer Assistance: None activities of daily living that may affect Patient Identification Verified: Yes risk of falls: Secondary Verification Process Completed: Yes Signs or symptoms of abuse/neglect since last visito No Patient Requires Transmission-Based Precautions: No Hospitalized since last visit: No Patient Has Alerts: No Implantable device outside of the clinic excluding No cellular tissue based products placed in the center since last visit: Has Dressing in Place as Prescribed: Yes Has Compression in Place as Prescribed: Yes Pain Present Now: No Electronic Signature(s) Signed: 12/12/2021 10:10:35 AM By: Carlene Coria RN Entered By: Carlene Coria on 12/11/2021 13:09:53 Wrage, Elliot Dally ([id number]) -------------------------------------------------------------------------------- Clinic Level of Care Assessment Details Patient Name: [name] Date of Service: 12/11/2021 1:15 PM Medical Record Number: [id number] Patient Account Number: [id number] Date of Birth/Sex: [birth year] (71 y.o. M) Treating RN: Carlene Coria Primary Care Serra Younan: Carley Hammed Other Clinician: Referring Meta Kroenke: Carley Hammed Treating Caprice Wasko/Extender: Skipper Cliche in Treatment: 3 Clinic Level of Care Assessment Items TOOL 1 Quantity Score []  - Use when EandM and Procedure is performed on INITIAL visit 0 ASSESSMENTS - Nursing Assessment / Reassessment []  - General Physical Exam (combine w/ comprehensive assessment (listed just below) when performed on new 0 pt. evals) []  - 0 Comprehensive Assessment (HX, ROS, Risk Assessments, Wounds Hx, etc.) ASSESSMENTS - Wound and Skin Assessment / Reassessment []  - Dermatologic / Skin Assessment (not related to wound area) 0 ASSESSMENTS - Ostomy and/or Continence Assessment and Care []  - Incontinence Assessment and Management 0 []  - 0 Ostomy Care Assessment and Management (repouching, etc.) PROCESS - Coordination of Care []  - Simple Patient / Family Education for ongoing care 0 []  - 0 Complex (extensive) Patient / Family Education for ongoing care []  - 0 Staff obtains Programmer, systems, Records, Test Results / Process Orders []  - 0 Staff telephones HHA, Nursing Homes / Clarify orders / etc []  - 0 Routine Transfer to another Facility (non-emergent condition) []  - 0 Routine Hospital Admission (non-emergent condition) []  - 0 New Admissions / Biomedical engineer / Ordering NPWT, Apligraf, etc. []  - 0 Emergency Hospital Admission (emergent condition) PROCESS - Special Needs []  - Pediatric / Minor Patient Management 0 []  - 0 Isolation Patient Management []  - 0 Hearing / Language / Visual special needs []  - 0 Assessment of Community assistance (transportation, D/C planning, etc.) []  - 0 Additional assistance / Altered mentation []  - 0 Support Surface(s) Assessment (bed, cushion, seat, etc.) INTERVENTIONS - Miscellaneous []  - External ear exam 0 []  - 0 Patient Transfer (multiple staff / Civil Service fast streamer / Similar devices) []  - 0 Simple Staple / Suture removal (25 or less) []  - 0 Complex Staple / Suture removal (26 or more) []  - 0 Hypo/Hyperglycemic Management (do not check if billed  separately) []  - 0 Ankle / Brachial Index (ABI) - do not check if billed separately Has the patient been seen at the hospital within the last  three years: Yes Total Score: 0 Level Of Care: ____ [name] ([id number]) Electronic Signature(s) Signed: 12/12/2021 10:10:35 AM By: Carlene Coria RN Entered By: Carlene Coria on 12/11/2021 13:42:27 Heinzman, Elliot Dally ([id number]) -------------------------------------------------------------------------------- Compression Therapy Details Patient Name: [name] Date of Service: 12/11/2021 1:15 PM Medical Record Number: [id number] Patient Account Number: [id number] Date of Birth/Sex: 27-Jul-1950 (71 y.o. M) Treating RN: Carlene Coria Primary Care Denitra Donaghey: Carley Hammed Other Clinician: Referring Om Lizotte: Carley Hammed Treating Zyara Riling/Extender: Skipper Cliche in Treatment: 3 Compression Therapy Performed for Wound Assessment: Wound #1 Right Lower Leg Performed By: Clinician Carlene Coria, RN Compression Type: Four Layer Post Procedure Diagnosis Same as Pre-procedure Electronic Signature(s) Signed: 12/12/2021 10:10:35 AM By: Carlene Coria RN Entered By: Carlene Coria on 12/11/2021 13:42:01 Minchey, Elliot Dally ([id number]) -------------------------------------------------------------------------------- Encounter Discharge Information Details Patient Name: [name] Date of Service: 12/11/2021 1:15 PM Medical Record Number: [id number] Patient Account Number: [id number] Date of Birth/Sex: July 08, 1950 (71 y.o. M) Treating RN: Carlene Coria Primary Care Savior Himebaugh: Carley Hammed Other Clinician: Referring Irelyn Perfecto: Carley Hammed Treating Troy Hartzog/Extender: Skipper Cliche in Treatment: 3 Encounter Discharge Information Items Discharge Condition: Stable Ambulatory Status: Walker Discharge Destination: Home Transportation: Private Auto Accompanied By: wife Schedule Follow-up Appointment: Yes Clinical  Summary of Care: Electronic Signature(s) Signed: 12/12/2021 10:10:35 AM By: Carlene Coria RN Entered By: Carlene Coria on 12/11/2021 13:43:26 Grisby, Elliot Dally ([id number]) -------------------------------------------------------------------------------- Lower Extremity Assessment Details Patient Name: [name] Date of Service: 12/11/2021 1:15 PM Medical Record Number: [id number] Patient Account Number: [id number] Date of Birth/Sex: [birth year] (71 y.o. M) Treating RN: Carlene Coria Primary Care Kaelin Holford: Carley Hammed Other Clinician: Referring Glenn Christo: Carley Hammed Treating Kyleigha Markert/Extender: Skipper Cliche in Treatment: 3 Edema Assessment Assessed: Shirlyn Goltz: No] [Right: No] Edema: [Left: Ye] [Right: s] Calf Left: Right: Point of Measurement: 37 cm From Medial Instep 52 cm Ankle Left: Right: Point of Measurement: 13 cm From Medial Instep 33 cm Knee To Floor Left: Right: From Medial Instep 47 cm Vascular Assessment Pulses: Dorsalis Pedis Palpable: [Right:Yes] Electronic Signature(s) Signed: 12/11/2021 1:28:14 PM By: Carlene Coria RN Entered By: Carlene Coria on 12/11/2021 13:28:14 Hanway, Elliot Dally ([id number]) -------------------------------------------------------------------------------- Multi Wound Chart Details Patient Name: [name] Date of Service: 12/11/2021 1:15 PM Medical Record Number: [id number] Patient Account Number: [id number] Date of Birth/Sex: 11-23-1950 (71 y.o. M) Treating RN: Carlene Coria Primary Care Kijuana Ruppel: Carley Hammed Other Clinician: Referring Agatha Duplechain: Carley Hammed Treating Jonell Krontz/Extender: Skipper Cliche in Treatment: 3 Vital Signs Height(in): 70 Pulse(bpm): 61 Weight(lbs): 340 Blood Pressure(mmHg): 155/82 Body Mass Index(BMI): 51.7 Temperature(F): 98.2 Respiratory Rate(breaths/min): 18 Photos: [N/A:N/A] Wound Location: Right Lower Leg N/A N/A Wounding Event: Bite N/A N/A Primary Etiology: Venous  Leg Ulcer N/A N/A Comorbid History: Asthma, Chronic Obstructive N/A N/A Pulmonary Disease (COPD), Hypertension Date Acquired: 12/15/1991 N/A N/A Weeks of Treatment: 3 N/A N/A Wound Status: Open N/A N/A Wound Recurrence: No N/A N/A Measurements L x W x D (cm) 1x0.5x0.2 N/A N/A Area (cm) : 0.393 N/A N/A Volume (cm) : 0.079 N/A N/A % Reduction in Area: 97.10% N/A N/A % Reduction in Volume: 94.30% N/A N/A Classification: Full Thickness Without Exposed N/A N/A Support Structures Exudate Amount: Medium N/A N/A Exudate Type: Serosanguineous N/A N/A Exudate Color: red, brown N/A N/A Granulation Amount: Large (67-100%) N/A N/A Granulation Quality: Red N/A N/A Exposed Structures: Fat Layer (Subcutaneous Tissue): N/A N/A Yes Fascia: No Tendon: No Muscle: No Joint: No Bone: No Epithelialization: Medium (34-66%) N/A N/A Treatment Notes Electronic Signature(s) Signed: 12/12/2021 10:10:35  AM By: Carlene Coria RN Entered By: Carlene Coria on 12/11/2021 13:19:14 [name] ([id number]) -------------------------------------------------------------------------------- [place] Details Patient Name: TACUMA, GRAFFAM Date of Service: 12/11/2021 1:15 PM Medical Record Number: [id number] Patient Account Number: [id number] Date of Birth/Sex: 1950/06/23 (71 y.o. M) Treating RN: Carlene Coria Primary Care Brooklin Rieger: Carley Hammed Other Clinician: Referring Britnie Colville: Carley Hammed Treating Barbie Croston/Extender: Skipper Cliche in Treatment: 3 Active Inactive Wound/Skin Impairment Nursing Diagnoses: Knowledge deficit related to ulceration/compromised skin integrity Goals: Patient/caregiver will verbalize understanding of skin care regimen Date Initiated: 11/14/2021 Target Resolution Date: 12/14/2021 Goal Status: Active Ulcer/skin breakdown will have a volume reduction of 30% by week 4 Date Initiated: 11/14/2021 Target Resolution Date: 12/14/2021 Goal Status:  Active Ulcer/skin breakdown will have a volume reduction of 50% by week 8 Date Initiated: 11/14/2021 Target Resolution Date: 01/14/2022 Goal Status: Active Ulcer/skin breakdown will have a volume reduction of 80% by week 12 Date Initiated: 11/14/2021 Target Resolution Date: 02/14/2022 Goal Status: Active Ulcer/skin breakdown will heal within 14 weeks Date Initiated: 11/14/2021 Target Resolution Date: 03/16/2022 Goal Status: Active Interventions: Assess patient/caregiver ability to obtain necessary supplies Assess patient/caregiver ability to perform ulcer/skin care regimen upon admission and as needed Assess ulceration(s) every visit Notes: Electronic Signature(s) Signed: 12/12/2021 10:10:35 AM By: Carlene Coria RN Entered By: Carlene Coria on 12/11/2021 13:19:07 Kempker, Elliot Dally ([id number]) -------------------------------------------------------------------------------- Pain Assessment Details Patient Name: [name] Date of Service: 12/11/2021 1:15 PM Medical Record Number: [id number] Patient Account Number: [id number] Date of Birth/Sex: 1951/05/01 (71 y.o. M) Treating RN: Carlene Coria Primary Care Yvaine Jankowiak: Carley Hammed Other Clinician: Referring Dudley Cooley: Carley Hammed Treating Ajwa Kimberley/Extender: Skipper Cliche in Treatment: 3 Active Problems Location of Pain Severity and Description of Pain Patient Has Paino No Site Locations Pain Management and Medication Current Pain Management: Electronic Signature(s) Signed: 12/12/2021 10:10:35 AM By: Carlene Coria RN Entered By: Carlene Coria on 12/11/2021 13:10:31 [name] ([id number]) -------------------------------------------------------------------------------- Patient/Caregiver Education Details Patient Name: [name] Date of Service: 12/11/2021 1:15 PM Medical Record Number: [id number] Patient Account Number: [id number] Date of Birth/Gender: 01-13-1951 (71 y.o. M) Treating RN: Carlene Coria Primary Care Physician: Carley Hammed Other Clinician: Referring Physician: Carley Hammed Treating Physician/Extender: Skipper Cliche in Treatment: 3 Education Assessment Education Provided To: Patient Education Topics Provided Wound/Skin Impairment: Methods: Explain/Verbal Responses: State content correctly Electronic Signature(s) Signed: 12/12/2021 10:10:35 AM By: Carlene Coria RN Entered By: Carlene Coria on 12/11/2021 13:42:51 Bendall, Elliot Dally ([id number]) -------------------------------------------------------------------------------- Wound Assessment Details Patient Name: [name] Date of Service: 12/11/2021 1:15 PM Medical Record Number: [id number] Patient Account Number: [id number] Date of Birth/Sex: Jul 01, 1950 (71 y.o. M) Treating RN: Carlene Coria Primary Care Yoav Okane: Carley Hammed Other Clinician: Referring Laurali Goddard: Carley Hammed Treating Brynn Reznik/Extender: Skipper Cliche in Treatment: 3 Wound Status Wound Number: 1 Primary Venous Leg Ulcer Etiology: Wound Location: Right Lower Leg Wound Status: Open Wounding Event: Bite Comorbid Asthma, Chronic Obstructive Pulmonary Disease Date Acquired: 12/15/1991 History: (COPD), Hypertension Weeks Of Treatment: 3 Clustered Wound: No Photos Wound Measurements Length: (cm) 1 Width: (cm) 0.5 Depth: (cm) 0.2 Area: (cm) 0.393 Volume: (cm) 0.079 % Reduction in Area: 97.1% % Reduction in Volume: 94.3% Epithelialization: Medium (34-66%) Tunneling: No Undermining: No Wound Description Classification: Full Thickness Without Exposed Support Structures Exudate Amount: Medium Exudate Type: Serosanguineous Exudate Color: red, brown Foul Odor After Cleansing: No Slough/Fibrino No Wound Bed Granulation Amount: Large (67-100%) Exposed Structure Granulation Quality: Red Fascia Exposed: No Fat Layer (Subcutaneous Tissue) Exposed: Yes Tendon Exposed: No Muscle  Exposed: No Joint Exposed:  No Bone Exposed: No Treatment Notes Wound #1 (Lower Leg) Wound Laterality: Right Cleanser [place]ram Ancillary Kit - 15 Day Supply Discharge Instruction: Use supplies as instructed; Kit contains: (15) Saline Bullets; (15) 3x3 Gauze; 15 pr Gloves Soap and Water Discharge Instruction: Gently cleanse wound with antibacterial soap, rinse and pat dry prior to dressing wounds LOGIN, MUCKLEROY ([id number]) Peri-Wound Care Topical Primary Dressing Silvercel Small 2x2 (in/in) Discharge Instruction: Apply Silvercel Small 2x2 (in/in) as instructed Secondary Dressing ABD Pad 5x9 (in/in) Discharge Instruction: Cover with ABD pad Gauze Discharge Instruction: As directed: dry, moistened with saline or moistened with Dakins Solution Secured With Compression Wrap Medichoice 4 layer Compression System, 35-40 mmHG Discharge Instruction: Apply multi-layer wrap as directed. Compression Stockings Circaid Juxta Lite Compression Wrap Quantity: 1 Right Leg Compression Amount: 20-30 mmHg Discharge Instruction: Apply Circaid Juxta Lite Compression Wrap as directed Add-Ons Electronic Signature(s) Signed: 12/12/2021 10:10:35 AM By: Carlene Coria RN Entered By: Carlene Coria on 12/11/2021 13:18:10 Demetrius, Elliot Dally ([id number]) -------------------------------------------------------------------------------- [place] Details Patient Name: [name] Date of Service: 12/11/2021 1:15 PM Medical Record Number: [id number] Patient Account Number: [id number] Date of Birth/Sex: 11/19/1950 (71 y.o. M) Treating RN: Carlene Coria Primary Care Aliyana Dlugosz: Carley Hammed Other Clinician: Referring Shakhia Gramajo: Carley Hammed Treating Lichelle Viets/Extender: Skipper Cliche in Treatment: 3 Vital Signs Time Taken: 13:09 Temperature (F): 98.2 Height (in): 68 Pulse (bpm): 64 Weight (lbs): 340 Respiratory Rate (breaths/min): 18 Body Mass Index (BMI): 51.7 Blood Pressure (mmHg): 155/82 Reference Range: 80 - 120  mg / dl Electronic Signature(s) Signed: 12/12/2021 10:10:35 AM By: Carlene Coria RN Entered By: Carlene Coria on 12/11/2021 13:10:25

## 2021-12-17 ENCOUNTER — Encounter: Payer: Medicare Other | Attending: Physician Assistant

## 2021-12-17 DIAGNOSIS — Z6841 Body Mass Index (BMI) 40.0 and over, adult: Secondary | ICD-10-CM | POA: Insufficient documentation

## 2021-12-17 DIAGNOSIS — I87331 Chronic venous hypertension (idiopathic) with ulcer and inflammation of right lower extremity: Secondary | ICD-10-CM | POA: Insufficient documentation

## 2021-12-17 DIAGNOSIS — I890 Lymphedema, not elsewhere classified: Secondary | ICD-10-CM | POA: Diagnosis not present

## 2021-12-17 DIAGNOSIS — I10 Essential (primary) hypertension: Secondary | ICD-10-CM | POA: Insufficient documentation

## 2021-12-17 DIAGNOSIS — J449 Chronic obstructive pulmonary disease, unspecified: Secondary | ICD-10-CM | POA: Diagnosis not present

## 2021-12-17 DIAGNOSIS — L97812 Non-pressure chronic ulcer of other part of right lower leg with fat layer exposed: Secondary | ICD-10-CM | POA: Diagnosis not present

## 2021-12-17 DIAGNOSIS — E669 Obesity, unspecified: Secondary | ICD-10-CM | POA: Diagnosis not present

## 2021-12-17 NOTE — Progress Notes (Signed)
COLSEN, MODI ([id number]) Visit Report for 12/17/2021 Arrival Information Details Patient Name: [name], [name]. Date of Service: 12/17/2021 3:45 PM Medical Record Number: [id number] Patient Account Number: [id number] Date of Birth/Sex: [birth year] (71 y.o. M) Treating RN: Laurina Bustle Primary Care Quintus Premo: Ventura Sellers Other Clinician: Referring Laila Myhre: Ventura Sellers Treating Kem Parcher/Extender: Tilda Franco in Treatment: 4 Visit Information History Since Last Visit Added or deleted any medications: No Patient Arrived: Ambulatory Any new allergies or adverse reactions: No Arrival Time: 15:40 Had a fall or experienced change in No Accompanied By: Wife activities of daily living that may affect Transfer Assistance: None risk of falls: Patient Identification Verified: Yes Hospitalized since last visit: No Secondary Verification Process Completed: Yes Has Dressing in Place as Prescribed: Yes Patient Requires Transmission-Based Precautions: No Has Compression in Place as Prescribed: Yes Patient Has Alerts: No Pain Present Now: No Electronic Signature(s) Signed: 12/17/2021 4:20:44 PM By: Laurina Bustle Entered By: Laurina Bustle on 12/17/2021 15:47:13 Maestas, [name] ([id number]) -------------------------------------------------------------------------------- Clinic Level of Care Assessment Details Patient Name: [name] Date of Service: 12/17/2021 3:45 PM Medical Record Number: [id number] Patient Account Number: [id number] Date of Birth/Sex: May 14, 1951 (71 y.o. M) Treating RN: Laurina Bustle Primary Care Alzora Ha: Ventura Sellers Other Clinician: Referring Kelcey Korus: Ventura Sellers Treating Jaleeah Slight/Extender: Tilda Franco in Treatment: 4 Clinic Level of Care Assessment Items TOOL 1 Quantity Score []  - Use when EandM and Procedure is performed on INITIAL visit 0 ASSESSMENTS - Nursing Assessment / Reassessment []  - General Physical Exam  (combine w/ comprehensive assessment (listed just below) when performed on new 0 pt. evals) []  - 0 Comprehensive Assessment (HX, ROS, Risk Assessments, Wounds Hx, etc.) ASSESSMENTS - Wound and Skin Assessment / Reassessment []  - Dermatologic / Skin Assessment (not related to wound area) 0 ASSESSMENTS - Ostomy and/or Continence Assessment and Care []  - Incontinence Assessment and Management 0 []  - 0 Ostomy Care Assessment and Management (repouching, etc.) PROCESS - Coordination of Care []  - Simple Patient / Family Education for ongoing care 0 []  - 0 Complex (extensive) Patient / Family Education for ongoing care []  - 0 Staff obtains , Records, Test Results / Process Orders []  - 0 Staff telephones HHA, Nursing Homes / Clarify orders / etc []  - 0 Routine Transfer to another Facility (non-emergent condition) []  - 0 Routine Hospital Admission (non-emergent condition) []  - 0 New Admissions / / Ordering NPWT, Apligraf, etc. []  - 0 Emergency Hospital Admission (emergent condition) PROCESS - Special Needs []  - Pediatric / Minor Patient Management 0 []  - 0 Isolation Patient Management []  - 0 Hearing / Language / Visual special needs []  - 0 Assessment of Community assistance (transportation, D/C planning, etc.) []  - 0 Additional assistance / Altered mentation []  - 0 Support Surface(s) Assessment (bed, cushion, seat, etc.) INTERVENTIONS - Miscellaneous []  - External ear exam 0 []  - 0 Patient Transfer (multiple staff / / Similar devices) []  - 0 Simple Staple / Suture removal (25 or less) []  - 0 Complex Staple / Suture removal (26 or more) []  - 0 Hypo/Hyperglycemic Management (do not check if billed separately) []  - 0 Ankle / Brachial Index (ABI) - do not check if billed separately Has the patient been seen at the hospital within the last three years: Yes Total Score: 0 Level Of Care: ____ ( ) Electronic  Signature(s) Signed: 12/17/2021 4:20:44 PM By: Entered By: on 12/17/2021 16:01:32 Amirault, Chiropractor ( ) -------------------------------------------------------------------------------- Compression Therapy  Details Patient Name: RIVEN, MABILE. Date of Service: 12/17/2021 3:45 PM Medical Record Number: [id number] Patient Account Number: [id number] Date of Birth/Sex: September 18, 1950 (71 y.o. M) Treating RN: Laurina Bustle Primary Care Tyshawna Alarid: Ventura Sellers Other Clinician: Referring Anakaren Campion: Ventura Sellers Treating Xavion Muscat/Extender: Tilda Franco in Treatment: 4 Compression Therapy Performed for Wound Assessment: Wound #1 Right Lower Leg Performed By: Clyde Lundborg, RN Compression Type: Four Layer Pre Treatment ABI: 1.2 Notes pt tolerated wrap without issue Electronic Signature(s) Signed: 12/17/2021 4:20:44 PM By: Laurina Bustle Entered By: Laurina Bustle on 12/17/2021 16:00:53 [name] ([id number]) -------------------------------------------------------------------------------- Encounter Discharge Information Details Patient Name: [name] Date of Service: 12/17/2021 3:45 PM Medical Record Number: [id number] Patient Account Number: [id number] Date of Birth/Sex: 08/10/1950 (71 y.o. M) Treating RN: Laurina Bustle Primary Care Aliana Kreischer: Ventura Sellers Other Clinician: Referring Carsyn Taubman: Ventura Sellers Treating Beula Joyner/Extender: Tilda Franco in Treatment: 4 Encounter Discharge Information Items Discharge Condition: Stable Ambulatory Status: Walker Discharge Destination: Home Transportation: Private Auto Accompanied By: Wife Schedule Follow-up Appointment: Yes Clinical Summary of Care: Electronic Signature(s) Signed: 12/17/2021 4:11:14 PM By: Laurina Bustle Entered By: Laurina Bustle on 12/17/2021 16:11:14 Ulrey, [name]  ([id number]) -------------------------------------------------------------------------------- Wound Assessment Details Patient Name: [name] Date of Service: 12/17/2021 3:45 PM Medical Record Number: [id number] Patient Account Number: [id number] Date of Birth/Sex: 12-17-1950 (71 y.o. M) Treating RN: Laurina Bustle Primary Care Eytan Carrigan: Ventura Sellers Other Clinician: Referring Camyla Camposano: Ventura Sellers Treating Cesia Orf/Extender: Tilda Franco in Treatment: 4 Wound Status Wound Number: 1 Primary Venous Leg Ulcer Etiology: Wound Location: Right Lower Leg Wound Status: Open Wounding Event: Bite Comorbid Asthma, Chronic Obstructive Pulmonary Disease Date Acquired: 12/15/1991 History: (COPD), Hypertension Weeks Of Treatment: 4 Clustered Wound: No Wound Measurements Length: (cm) 1 Width: (cm) 0.5 Depth: (cm) 0.2 Area: (cm) 0.393 Volume: (cm) 0.079 % Reduction in Area: 97.1% % Reduction in Volume: 94.3% Epithelialization: Medium (34-66%) Wound Description Classification: Full Thickness Without Exposed Support Structu Exudate Amount: Medium Exudate Type: Serosanguineous Exudate Color: red, brown res Foul Odor After Cleansing: No Slough/Fibrino No Wound Bed Granulation Amount: Large (67-100%) Exposed Structure Granulation Quality: Red Fascia Exposed: No Fat Layer (Subcutaneous Tissue) Exposed: Yes Tendon Exposed: No Muscle Exposed: No Joint Exposed: No Bone Exposed: No Treatment Notes Wound #1 (Lower Leg) Wound Laterality: Right Cleanser Peri-Wound Care Topical Primary Dressing Secondary Dressing Secured With Compression Wrap Compression Stockings Add-Ons Electronic Signature(s) Signed: 12/17/2021 4:20:44 PM By: Laurina Bustle Entered By: Laurina Bustle on 12/17/2021 16:00:29

## 2021-12-25 ENCOUNTER — Encounter: Payer: Medicare Other | Admitting: Physician Assistant

## 2021-12-25 DIAGNOSIS — I87331 Chronic venous hypertension (idiopathic) with ulcer and inflammation of right lower extremity: Secondary | ICD-10-CM | POA: Diagnosis not present

## 2021-12-25 NOTE — Progress Notes (Addendum)
LAURIE, LOVEJOY ([id number]) Visit Report for 12/25/2021 Chief Complaint Document Details Patient Name: [name], [name]. Date of Service: 12/25/2021 1:15 PM Medical Record Number: [id number] Patient Account Number: [id number] Date of Birth/Sex: 10-26-1950 (71 y.o. 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 M) Treating RN: Carlene Coria Primary Care Provider: Carley Hammed Other Clinician: Referring Provider: Carley Hammed Treating Provider/Extender: Skipper Cliche in Treatment: 5 History of Present Illness HPI Description: 11-14-2021 upon evaluation today patient presents for initial evaluation here in our clinic concerning issues he is having with his right lower extremity he does have a wound on the posterior aspect. This has been present intermittently honestly since 1993. 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 There is a lot of scar tissue which I think is probably the reason why this reopens specifically in regard to the fact that he does have chronic venous insufficiency and lymphedema in his extremities. This means anytime his legs swell this is good to be the most likely area to  weep. Right now its been about 3 weeks they have been trying to get this closed at this time and to be honest generally it closes much more quickly than that. That is the reason he has been seen today to see if we can get this closed. Has been on 3 rounds of antibiotics without benefit. On 10-01-2021 the patient did have stents placed bilaterally in the lower extremities and ablation on the left side done. This is outside of our visible EMR system I was not able to confirm this independently but his arterial flow and ABIs in the clinic today was 1.33 on the right. He also was started on Augmentin on May 15 for 10-day course. That is done at this point. Otherwise he has a history of hypertension and COPD. 11-21-2021 upon evaluation today patient appears to be doing well with regard to his wound. In fact this is showing signs of good improvement. It still slow to heal but nonetheless I think we are seeing improvements in size and overall appearance of the wound bed and general. I am very pleased in that regard. 12-04-2021 upon evaluation today patient appears to be doing well currently in regard to his wound and this is actually showing signs of significant improvement which is great news. He has been in the hospital and therefore we have not seen him for a few weeks but nonetheless in the interim his wound has dramatically gotten better which is great news. I do not see any evidence of infection right now. 12-11-2021 upon evaluation today patient appears to be doing well currently in regard to the wound on his leg. He has been tolerating the dressing changes without complication. Fortunately there does not appear to be any signs of active infection locally or systemically at this point which is great news. No fevers, chills, nausea, vomiting, or diarrhea. 12-25-2021 upon evaluation  today patient appears to be doing well currently in regard to his wound. He has been tolerating the dressing changes without  complication and this is actually showing signs of significant improvement. Fortunately there does not appear to be any signs of active infection at this time. No fevers, chills, nausea, vomiting, or diarrhea. Electronic Signature(s) Signed: 12/25/2021 1:58:43 PM By: Worthy Keeler PA-C Entered By: Worthy Keeler on 12/25/2021 13:58:43 [place], Elliot Dally ([id number]) -------------------------------------------------------------------------------- Physical Exam Details Patient Name: [name] Date of Service: 12/25/2021 1:15 PM Medical Record Number: [id number] Patient Account Number: [id number] Date of Birth/Sex: 1951/05/09 (71 y.o. M) Treating RN: Carlene Coria Primary Care Provider: Carley Hammed Other Clinician: Referring Provider: Carley Hammed Treating Provider/Extender: Skipper Cliche in Treatment: 5 Constitutional Obese and well-hydrated in no acute distress. Respiratory normal breathing without difficulty. Psychiatric this patient is able to make decisions and demonstrates good insight into disease process. Alert and Oriented x 3. pleasant and cooperative. Notes Upon inspection patient's wound bed showed signs of good granulation and epithelization at this point. Fortunately I do not see any evidence of active infection which is great news and overall I definitely think we are on the right track here. I think the patient is very close to complete resolution he just has a very tiny area that may be open and draining just a bit. Electronic Signature(s) Signed: 12/25/2021 1:59:04 PM By: Worthy Keeler PA-C Entered By: Worthy Keeler on 12/25/2021 13:59:04 [place], Elliot Dally ([id number]) -------------------------------------------------------------------------------- Physician Orders Details Patient Name: [name] Date of Service: 12/25/2021 1:15 PM Medical Record Number: [id number] Patient Account Number: [id number] Date of Birth/Sex: Dec 26, 1950  (71 y.o. M) Treating RN: Carlene Coria Primary Care Provider: Carley Hammed Other Clinician: Referring Provider: Carley Hammed Treating Provider/Extender: Skipper Cliche in Treatment: 5 Verbal / Phone Orders: No Diagnosis Coding ICD-10 Coding Code Description [phone] Chronic venous hypertension (idiopathic) with ulcer and inflammation of right lower extremity I89.0 Lymphedema, not elsewhere classified L97.812 Non-pressure chronic ulcer of other part of right lower leg with fat layer exposed I10 Essential (primary) hypertension J44.9 Chronic obstructive pulmonary disease, unspecified Follow-up Appointments o Return Appointment in 1 week. Bathing/ Shower/ Hygiene o May shower with wound dressing protected with water repellent cover or cast protector. Edema Control - Lymphedema / Segmental Compressive Device / Other o Elevate, Exercise Daily and Avoid Standing for Long Periods of Time. o Elevate legs to the level of the heart and pump ankles as often as possible o Elevate leg(s) parallel to the floor when sitting. Wound Treatment Wound #1 - Lower Leg Wound Laterality: Right Cleanser: Byram Ancillary Kit - 15 Day Supply 1 x Per Week/30 Days Discharge Instructions: Use supplies as instructed; Kit contains: (15) Saline Bullets; (15) 3x3 Gauze; 15 pr Gloves Cleanser: Soap and Water 1 x Per Week/30 Days Discharge Instructions: Gently cleanse wound with antibacterial soap, rinse and pat dry prior to dressing wounds Primary Dressing: Silvercel Small 2x2 (in/in) 1 x Per Week/30 Days Discharge Instructions: Apply Silvercel Small 2x2 (in/in) as instructed Secondary Dressing: ABD Pad 5x9 (in/in) 1 x Per Week/30 Days Discharge Instructions: Cover with ABD pad Secondary Dressing: Gauze 1 x Per Week/30 Days Discharge Instructions: As directed: dry, moistened with saline or moistened with Dakins Solution Compression Wrap: Medichoice 4 layer Compression System, 35-40 mmHG 1 x Per  Week/30 Days Discharge Instructions: Apply multi-layer wrap as directed. Compression Stockings: Circaid Juxta Lite Compression Wrap Right Leg Compression Amount: 20-30 mmHG Discharge Instructions: Apply Circaid Juxta  Lite Compression Wrap as directed Electronic Signature(s) Signed: 12/25/2021 4:35:29 PM By: Worthy Keeler PA-C Signed: 12/26/2021 10:45:13 AM By: Carlene Coria RN Entered By: Carlene Coria on 12/25/2021 13:53:02 [place], Elliot Dally ([id number]) -------------------------------------------------------------------------------- Problem List Details Patient Name: PATRICH, HEINZE Date of Service: 12/25/2021 1:15 PM Medical Record Number: [id number] Patient Account Number: [id number] Date of Birth/Sex: [birth year] (71 y.o. M) Treating RN: Carlene Coria Primary Care Provider: Carley Hammed Other Clinician: Referring Provider: Carley Hammed Treating Provider/Extender: Skipper Cliche in Treatment: 5 Active Problems ICD-10 Encounter Code Description Active Date MDM Diagnosis I87.331 Chronic venous hypertension (idiopathic) with ulcer and inflammation of 11/14/2021 No Yes right lower extremity I89.0 Lymphedema, not elsewhere classified 11/14/2021 No Yes L97.812 Non-pressure chronic ulcer of other part of right lower leg with fat layer 11/14/2021 No Yes exposed [place] (primary) hypertension 11/14/2021 No Yes J44.9 Chronic obstructive pulmonary disease, unspecified 11/14/2021 No Yes Inactive Problems Resolved Problems Electronic Signature(s) Signed: 12/25/2021 1:06:01 PM By: Worthy Keeler PA-C Entered By: Worthy Keeler on 12/25/2021 13:06:01 [place], Elliot Dally ([id number]) -------------------------------------------------------------------------------- Progress Note Details Patient Name: [name] Date of Service: 12/25/2021 1:15 PM Medical Record Number: [id number] Patient Account Number: [id number] Date of Birth/Sex: May 19, 1951 (71 y.o. 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 This has been present intermittently honestly since 1993. 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 There is a lot of scar tissue which I think is probably the reason why this reopens specifically in regard to the fact that he does have chronic venous insufficiency and lymphedema in his extremities. This means anytime his legs swell this is good to be the most likely area to weep. Right now its been about 3 weeks they have been trying to get this closed at this time and to be honest generally it closes much more quickly than that. That is the reason he has been seen today to see if we can get this closed. Has been on 3 rounds of antibiotics without benefit. On 10-01-2021 the patient did have stents placed bilaterally in the lower extremities and ablation on the left side done. This is outside of our visible EMR system I was not able to confirm this independently but his arterial flow and ABIs in the clinic today was 1.33 on the right. He also was started on Augmentin on May 15 for 10-day course. That is done at this point. Otherwise he has a history of hypertension and COPD. 11-21-2021 upon evaluation today patient appears to be doing well with regard to his wound. In fact this is showing signs of good improvement. It still slow to heal but nonetheless I think we are seeing  improvements in size and overall appearance of the wound bed and general. I am very pleased in that regard. 12-04-2021 upon evaluation today patient appears to be doing well currently in regard to his wound and this is actually showing signs of significant improvement which is great news. He  has been in the hospital and therefore we have not seen him for a few weeks but nonetheless in the interim his wound has dramatically gotten better which is great news. I do not see any evidence of infection right now. 12-11-2021 upon evaluation today patient appears to be doing well currently in regard to the wound on his leg. He has been tolerating the dressing changes without complication. Fortunately there does not appear to be any signs of active infection locally or systemically at this point which is great news. No fevers, chills, nausea, vomiting, or diarrhea. 12-25-2021 upon evaluation today patient appears to be doing well currently in regard to his wound. He has been tolerating the dressing changes without complication and this is actually showing signs of significant improvement. Fortunately there does not appear to be any signs of active infection at this time. No fevers, chills, nausea, vomiting, or diarrhea. Objective Constitutional Obese and well-hydrated in no acute distress. Vitals Time Taken: 1:24 PM, Height: 68 in, Weight: 340 lbs, BMI: 51.7, Temperature: 97.9 F, Pulse: 69 bpm, Respiratory Rate: 18 breaths/min, Blood Pressure: 165/96 mmHg. Respiratory normal breathing without difficulty. Psychiatric this patient is able to make decisions and demonstrates good insight into disease process. Alert and Oriented x 3. pleasant and cooperative. General Notes: Upon inspection patient's wound bed showed signs of good granulation and epithelization at this point. Fortunately I do not see any evidence of active infection which is great news and overall I definitely think we are on the right track  here. I think the patient is very close to complete resolution he just has a very tiny area that may be open and draining just a bit. Integumentary (Hair, Skin) Wound #1 status is Open. Original cause of wound was Bite. The date acquired was: 12/15/1991. The wound has been in treatment 5 weeks. The WOODRUFF, SKIRVIN ([id number]) wound is located on the Right Lower Leg. The wound measures 0.1cm length x 0.1cm width x 0.1cm depth; 0.008cm^2 area and 0.001cm^3 volume. There is Fat Layer (Subcutaneous Tissue) exposed. There is no tunneling or undermining noted. There is a none present amount of drainage noted. There is large (67-100%) red granulation within the wound bed. Assessment Active Problems ICD-10 Chronic venous hypertension (idiopathic) with ulcer and inflammation of right lower extremity Lymphedema, not elsewhere classified Non-pressure chronic ulcer of other part of right lower leg with fat layer exposed Essential (primary) hypertension Chronic obstructive pulmonary disease, unspecified Procedures Wound #1 Pre-procedure diagnosis of Wound #1 is a Venous Leg Ulcer located on the Right Lower Leg . There was a Four Layer Compression Therapy Procedure by Carlene Coria, RN. Post procedure Diagnosis Wound #1: Same as Pre-Procedure Plan Follow-up Appointments: Return Appointment in 1 week. Bathing/ Shower/ Hygiene: May shower with wound dressing protected with water repellent cover or cast protector. Edema Control - Lymphedema / Segmental Compressive Device / Other: Elevate, Exercise Daily and Avoid Standing for Long Periods of Time. Elevate legs to the level of the heart and pump ankles as often as possible Elevate leg(s) parallel to the floor when sitting. WOUND #1: - Lower Leg Wound Laterality: Right Cleanser: Byram Ancillary Kit - 15 Day Supply 1 x Per Week/30 Days Discharge Instructions: Use supplies as instructed; Kit contains: (15) Saline Bullets; (15) 3x3 Gauze; 15 pr  Gloves Cleanser: Soap and Water 1 x Per Week/30 Days Discharge Instructions: Gently cleanse wound with antibacterial soap, rinse and pat dry prior to dressing wounds Primary Dressing: Silvercel Small 2x2 (in/in) 1 x Per  Week/30 Days Discharge Instructions: Apply Silvercel Small 2x2 (in/in) as instructed Secondary Dressing: ABD Pad 5x9 (in/in) 1 x Per Week/30 Days Discharge Instructions: Cover with ABD pad Secondary Dressing: Gauze 1 x Per Week/30 Days Discharge Instructions: As directed: dry, moistened with saline or moistened with Dakins Solution Compression Wrap: Medichoice 4 layer Compression System, 35-40 mmHG 1 x Per Week/30 Days Discharge Instructions: Apply multi-layer wrap as directed. Compression Stockings: Circaid Juxta Lite Compression Wrap Compression Amount: 20-30 mmHg (right) Discharge Instructions: Apply Circaid Juxta Lite Compression Wrap as directed 1. I recommend that we going continue with the wound care measures as before and the patient is in agreement with plan this includes the use of the silver alginate dressing followed by an ABD pad to cover. 2. Also can recommend that we have the patient continue with the 4-layer compression wrap which is keeping edema under good control. 3. I am also going to suggest that he continue with elevating the legs much as possible to help with edema control. We will see patient back for reevaluation in 1 week here in the clinic. If anything worsens or changes patient will contact our office for additional recommendations. Hopefully he will be healed by next week. SHIHEEM, CORPORAN ([id number]) Electronic Signature(s) Signed: 12/25/2021 1:59:42 PM By: Worthy Keeler PA-C Entered By: Worthy Keeler on 12/25/2021 13:59:42 [place], Elliot Dally ([id number]) -------------------------------------------------------------------------------- [place] Details Patient Name: [name] Date of Service: 12/25/2021 Medical Record  Number: [id number] Patient Account Number: [id number] Date of Birth/Sex: 12/22/1950 (71 y.o. M) Treating RN: Carlene Coria Primary Care Provider: Carley Hammed Other Clinician: Referring Provider: Carley Hammed Treating Provider/Extender: Skipper Cliche in Treatment: 5 Diagnosis Coding ICD-10 Codes Code Description [id number] Chronic venous hypertension (idiopathic) with ulcer and inflammation of right lower extremity I89.0 Lymphedema, not elsewhere classified L97.812 Non-pressure chronic ulcer of other part of right lower leg with fat layer exposed I10 Essential (primary) hypertension J44.9 Chronic obstructive pulmonary disease, unspecified Facility Procedures CPT4 Code: [id number] Description: (Facility Use Only) [phone] - [facility] L[facility] RT LEG Modifier: Quantity: 1 Physician Procedures CPT4 Code Description: [id number] - WC PHYS LEVEL 3 - EST PT Modifier: Quantity: 1 CPT4 Code Description: ICD-10 Diagnosis Description I87.331 Chronic venous hypertension (idiopathic) with ulcer and inflammation of I89.0 Lymphedema, not elsewhere classified L97.812 Non-pressure chronic ulcer of other part of right lower leg with fat la  I10 Essential (primary) hypertension Modifier: right lower extremit yer exposed Quantity: y Engineer, maintenance) Signed: 12/25/2021 1:59:58 PM By: Worthy Keeler PA-C Entered By: Worthy Keeler on 12/25/2021 13:59:58

## 2021-12-25 NOTE — Progress Notes (Addendum)
Wesley, Bryant ([id number]) Visit Report for 12/25/2021 Arrival Information Details Patient Name: [name], [name] Date of Service: 12/25/2021 1:15 PM Medical Record Number: [id number] Patient Account Number: [id number] Date of Birth/Sex: 21-Aug-1950 (71 y.o. M) Treating RN: Carlene Coria Primary Care Senetra Dillin: Carley Hammed Other Clinician: Referring Georgeanna Radziewicz: Carley Hammed Treating Lorrane Mccay/Extender: Skipper Cliche in Treatment: 5 Visit Information History Since Last Visit All ordered tests and consults were completed: No Patient Arrived: Gilford Rile Added or deleted any medications: No Arrival Time: 13:20 Any new allergies or adverse reactions: No Accompanied By: self Had a fall or experienced change in No Transfer Assistance: None activities of daily living that may affect Patient Identification Verified: Yes risk of falls: Secondary Verification Process Completed: Yes Signs or symptoms of abuse/neglect since last visito No Patient Requires Transmission-Based Precautions: No Hospitalized since last visit: No Patient Has Alerts: No Implantable device outside of the clinic excluding No cellular tissue based products placed in the center since last visit: Has Dressing in Place as Prescribed: Yes Pain Present Now: No Electronic Signature(s) Signed: 12/26/2021 10:45:13 AM By: Carlene Coria RN Entered By: Carlene Coria on 12/25/2021 13:24:55 Mohabir, Elliot Dally ([id number]) -------------------------------------------------------------------------------- Clinic Level of Care Assessment Details Patient Name: [name] Date of Service: 12/25/2021 1:15 PM Medical Record Number: [id number] Patient Account Number: [id number] Date of Birth/Sex: [birth year] (71 y.o. M) Treating RN: Carlene Coria Primary Care Dorina Ribaudo: Carley Hammed Other Clinician: Referring Berlie Hatchel: Carley Hammed Treating Sherrita Riederer/Extender: Skipper Cliche in Treatment: 5 Clinic Level of Care  Assessment Items TOOL 1 Quantity Score []  - Use when EandM and Procedure is performed on INITIAL visit 0 ASSESSMENTS - Nursing Assessment / Reassessment []  - General Physical Exam (combine w/ comprehensive assessment (listed just below) when performed on new 0 pt. evals) []  - 0 Comprehensive Assessment (HX, ROS, Risk Assessments, Wounds Hx, etc.) ASSESSMENTS - Wound and Skin Assessment / Reassessment []  - Dermatologic / Skin Assessment (not related to wound area) 0 ASSESSMENTS - Ostomy and/or Continence Assessment and Care []  - Incontinence Assessment and Management 0 []  - 0 Ostomy Care Assessment and Management (repouching, etc.) PROCESS - Coordination of Care []  - Simple Patient / Family Education for ongoing care 0 []  - 0 Complex (extensive) Patient / Family Education for ongoing care []  - 0 Staff obtains Programmer, systems, Records, Test Results / Process Orders []  - 0 Staff telephones HHA, Nursing Homes / Clarify orders / etc []  - 0 Routine Transfer to another Facility (non-emergent condition) []  - 0 Routine Hospital Admission (non-emergent condition) []  - 0 New Admissions / Biomedical engineer / Ordering NPWT, Apligraf, etc. []  - 0 Emergency Hospital Admission (emergent condition) PROCESS - Special Needs []  - Pediatric / Minor Patient Management 0 []  - 0 Isolation Patient Management []  - 0 Hearing / Language / Visual special needs []  - 0 Assessment of Community assistance (transportation, D/C planning, etc.) []  - 0 Additional assistance / Altered mentation []  - 0 Support Surface(s) Assessment (bed, cushion, seat, etc.) INTERVENTIONS - Miscellaneous []  - External ear exam 0 []  - 0 Patient Transfer (multiple staff / Civil Service fast streamer / Similar devices) []  - 0 Simple Staple / Suture removal (25 or less) []  - 0 Complex Staple / Suture removal (26 or more) []  - 0 Hypo/Hyperglycemic Management (do not check if billed separately) []  - 0 Ankle / Brachial Index (ABI) - do not  check if billed separately Has the patient been seen at the hospital within the last three years: Yes Total Score: 0 Level  Of Care: ____ [name] ([id number]) Electronic Signature(s) Signed: 12/26/2021 10:45:13 AM By: Carlene Coria RN Entered By: Carlene Coria on 12/25/2021 13:53:10 Vila, Elliot Dally ([id number]) -------------------------------------------------------------------------------- Compression Therapy Details Patient Name: [name] Date of Service: 12/25/2021 1:15 PM Medical Record Number: [id number] Patient Account Number: [id number] Date of Birth/Sex: January 24, 1951 (71 y.o. M) Treating RN: Carlene Coria Primary Care Romulus Hanrahan: Carley Hammed Other Clinician: Referring Shanon Becvar: Carley Hammed Treating Sotiria Keast/Extender: Skipper Cliche in Treatment: 5 Compression Therapy Performed for Wound Assessment: Wound #1 Right Lower Leg Performed By: Clinician Carlene Coria, RN Compression Type: Four Layer Post Procedure Diagnosis Same as Pre-procedure Electronic Signature(s) Signed: 12/26/2021 10:45:13 AM By: Carlene Coria RN Entered By: Carlene Coria on 12/25/2021 13:51:49 Florence, Elliot Dally ([id number]) -------------------------------------------------------------------------------- Encounter Discharge Information Details Patient Name: [name] Date of Service: 12/25/2021 1:15 PM Medical Record Number: [id number] Patient Account Number: [id number] Date of Birth/Sex: 1950-07-25 (71 y.o. M) Treating RN: Carlene Coria Primary Care Mariel Lukins: Carley Hammed Other Clinician: Referring Conchita Truxillo: Carley Hammed Treating Xitlaly Ault/Extender: Skipper Cliche in Treatment: 5 Encounter Discharge Information Items Discharge Condition: Stable Ambulatory Status: Ambulatory Discharge Destination: Home Transportation: Private Auto Accompanied By: wife Schedule Follow-up Appointment: Yes Clinical Summary of Care: Electronic Signature(s) Signed:  12/26/2021 10:45:13 AM By: Carlene Coria RN Entered By: Carlene Coria on 12/25/2021 13:54:10 Losada, Elliot Dally ([id number]) -------------------------------------------------------------------------------- Lower Extremity Assessment Details Patient Name: [name] Date of Service: 12/25/2021 1:15 PM Medical Record Number: [id number] Patient Account Number: [id number] Date of Birth/Sex: [birth year] (71 y.o. M) Treating RN: Carlene Coria Primary Care Gracen Ringwald: Carley Hammed Other Clinician: Referring Samantha Olivera: Carley Hammed Treating Chelcee Korpi/Extender: Skipper Cliche in Treatment: 5 Edema Assessment Assessed: Shirlyn Goltz: No] [Right: No] Edema: [Left: Ye] [Right: s] Calf Left: Right: Point of Measurement: 37 cm From Medial Instep 47 cm Ankle Left: Right: Point of Measurement: 13 cm From Medial Instep 32 cm Knee To Floor Left: Right: From Medial Instep 47 cm Vascular Assessment Pulses: Dorsalis Pedis Palpable: [Right:Yes] Electronic Signature(s) Signed: 12/26/2021 10:45:13 AM By: Carlene Coria RN Entered By: Carlene Coria on 12/25/2021 13:35:11 Waynick, Elliot Dally ([id number]) -------------------------------------------------------------------------------- Multi Wound Chart Details Patient Name: [name] Date of Service: 12/25/2021 1:15 PM Medical Record Number: [id number] Patient Account Number: [id number] Date of Birth/Sex: Sep 14, 1950 (71 y.o. M) Treating RN: Carlene Coria Primary Care Dwon Sky: Carley Hammed Other Clinician: Referring Darvell Monteforte: Carley Hammed Treating Harper Smoker/Extender: Skipper Cliche in Treatment: 5 Vital Signs Height(in): 32 Pulse(bpm): 8 Weight(lbs): 340 Blood Pressure(mmHg): 165/96 Body Mass Index(BMI): 51.7 Temperature(F): 97.9 Respiratory Rate(breaths/min): 18 Photos: [N/A:N/A] Wound Location: Right Lower Leg N/A N/A Wounding Event: Bite N/A N/A Primary Etiology: Venous Leg Ulcer N/A N/A Comorbid History: Asthma,  Chronic Obstructive N/A N/A Pulmonary Disease (COPD), Hypertension Date Acquired: 12/15/1991 N/A N/A Weeks of Treatment: 5 N/A N/A Wound Status: Open N/A N/A Wound Recurrence: No N/A N/A Measurements L x W x D (cm) 0.1x0.1x0.1 N/A N/A Area (cm) : 0.008 N/A N/A Volume (cm) : 0.001 N/A N/A % Reduction in Area: 99.90% N/A N/A % Reduction in Volume: 99.90% N/A N/A Classification: Full Thickness Without Exposed N/A N/A Support Structures Exudate Amount: None Present N/A N/A Granulation Amount: Large (67-100%) N/A N/A Granulation Quality: Red N/A N/A Exposed Structures: Fat Layer (Subcutaneous Tissue): N/A N/A Yes Fascia: No Tendon: No Muscle: No Joint: No Bone: No Epithelialization: Medium (34-66%) N/A N/A Treatment Notes Electronic Signature(s) Signed: 12/26/2021 10:45:13 AM By: Carlene Coria RN Entered By: Carlene Coria on 12/25/2021 13:35:25 Ferrufino, Elliot Dally ([id number]) --------------------------------------------------------------------------------  Multi-Disciplinary Care Plan Details Patient Name: ANUJ, SUMMONS. Date of Service: 12/25/2021 1:15 PM Medical Record Number: [id number] Patient Account Number: [id number] Date of Birth/Sex: 29-May-1951 (71 y.o. M) Treating RN: Carlene Coria Primary Care Lourdes Manning: Carley Hammed Other Clinician: Referring Anie Juniel: Carley Hammed Treating Rodger Giangregorio/Extender: Skipper Cliche in Treatment: 5 Active Inactive Wound/Skin Impairment Nursing Diagnoses: Knowledge deficit related to ulceration/compromised skin integrity Goals: Patient/caregiver will verbalize understanding of skin care regimen Date Initiated: 11/14/2021 Target Resolution Date: 12/14/2021 Goal Status: Active Ulcer/skin breakdown will have a volume reduction of 30% by week 4 Date Initiated: 11/14/2021 Target Resolution Date: 12/14/2021 Goal Status: Active Ulcer/skin breakdown will have a volume reduction of 50% by week 8 Date Initiated: 11/14/2021 Target Resolution  Date: 01/14/2022 Goal Status: Active Ulcer/skin breakdown will have a volume reduction of 80% by week 12 Date Initiated: 11/14/2021 Target Resolution Date: 02/14/2022 Goal Status: Active Ulcer/skin breakdown will heal within 14 weeks Date Initiated: 11/14/2021 Target Resolution Date: 03/16/2022 Goal Status: Active Interventions: Assess patient/caregiver ability to obtain necessary supplies Assess patient/caregiver ability to perform ulcer/skin care regimen upon admission and as needed Assess ulceration(s) every visit Notes: Electronic Signature(s) Signed: 12/26/2021 10:45:13 AM By: Carlene Coria RN Entered By: Carlene Coria on 12/25/2021 13:35:16 Havlicek, Elliot Dally ([id number]) -------------------------------------------------------------------------------- Pain Assessment Details Patient Name: [name] Date of Service: 12/25/2021 1:15 PM Medical Record Number: [id number] Patient Account Number: [id number] Date of Birth/Sex: [birth year] (71 y.o. M) Treating RN: Carlene Coria Primary Care Caulder Wehner: Carley Hammed Other Clinician: Referring Raeden Schippers: Carley Hammed Treating Ashonti Leandro/Extender: Skipper Cliche in Treatment: 5 Active Problems Location of Pain Severity and Description of Pain Patient Has Paino No Site Locations Pain Management and Medication Current Pain Management: Electronic Signature(s) Signed: 12/26/2021 10:45:13 AM By: Carlene Coria RN Entered By: Carlene Coria on 12/25/2021 13:25:27 Drumheller, Elliot Dally ([id number]) -------------------------------------------------------------------------------- Patient/Caregiver Education Details Patient Name: [name] Date of Service: 12/25/2021 1:15 PM Medical Record Number: [id number] Patient Account Number: [id number] Date of Birth/Gender: [birth year] (71 y.o. M) Treating RN: Carlene Coria Primary Care Physician: Carley Hammed Other Clinician: Referring Physician: Carley Hammed Treating  Physician/Extender: Skipper Cliche in Treatment: 5 Education Assessment Education Provided To: Patient Education Topics Provided Wound/Skin Impairment: Methods: Explain/Verbal Responses: State content correctly Electronic Signature(s) Signed: 12/26/2021 10:45:13 AM By: Carlene Coria RN Entered By: Carlene Coria on 12/25/2021 13:53:31 Berkovich, Elliot Dally ([id number]) -------------------------------------------------------------------------------- Wound Assessment Details Patient Name: [name] Date of Service: 12/25/2021 1:15 PM Medical Record Number: [id number] Patient Account Number: [id number] Date of Birth/Sex: 1950-08-21 (71 y.o. M) Treating RN: Carlene Coria Primary Care Nadia Torr: Carley Hammed Other Clinician: Referring Peyten Weare: Carley Hammed Treating Ashia Dehner/Extender: Skipper Cliche in Treatment: 5 Wound Status Wound Number: 1 Primary Venous Leg Ulcer Etiology: Wound Location: Right Lower Leg Wound Status: Open Wounding Event: Bite Comorbid Asthma, Chronic Obstructive Pulmonary Disease Date Acquired: 12/15/1991 History: (COPD), Hypertension Weeks Of Treatment: 5 Clustered Wound: No Photos Wound Measurements Length: (cm) 0.1 Width: (cm) 0.1 Depth: (cm) 0.1 Area: (cm) 0.008 Volume: (cm) 0.001 % Reduction in Area: 99.9% % Reduction in Volume: 99.9% Epithelialization: Medium (34-66%) Tunneling: No Undermining: No Wound Description Classification: Full Thickness Without Exposed Support Structures Exudate Amount: None Present Foul Odor After Cleansing: No Slough/Fibrino No Wound Bed Granulation Amount: Large (67-100%) Exposed Structure Granulation Quality: Red Fascia Exposed: No Fat Layer (Subcutaneous Tissue) Exposed: Yes Tendon Exposed: No Muscle Exposed: No Joint Exposed: No Bone Exposed: No Treatment Notes Wound #1 (Lower Leg) Wound Laterality: Right Cleanser [place]ram Ancillary Kit - 15  Day Supply Discharge Instruction: Use supplies  as instructed; Kit contains: (15) Saline Bullets; (15) 3x3 Gauze; 15 pr Gloves Soap and Water Discharge Instruction: Gently cleanse wound with antibacterial soap, rinse and pat dry prior to dressing wounds KRISTINE, TILEY ([id number]) Peri-Wound Care Topical Primary Dressing Silvercel Small 2x2 (in/in) Discharge Instruction: Apply Silvercel Small 2x2 (in/in) as instructed Secondary Dressing ABD Pad 5x9 (in/in) Discharge Instruction: Cover with ABD pad Gauze Discharge Instruction: As directed: dry, moistened with saline or moistened with Dakins Solution Secured With Compression Wrap Medichoice 4 layer Compression System, 35-40 mmHG Discharge Instruction: Apply multi-layer wrap as directed. Compression Stockings Circaid Juxta Lite Compression Wrap Quantity: 1 Right Leg Compression Amount: 20-30 mmHg Discharge Instruction: Apply Circaid Juxta Lite Compression Wrap as directed Add-Ons Electronic Signature(s) Signed: 12/26/2021 10:45:13 AM By: Carlene Coria RN Entered By: Carlene Coria on 12/25/2021 13:34:19 Sweaney, Elliot Dally ([id number]) -------------------------------------------------------------------------------- Superior Details Patient Name: [name] Date of Service: 12/25/2021 1:15 PM Medical Record Number: [id number] Patient Account Number: [id number] Date of Birth/Sex: 1951/03/13 (72 y.o. M) Treating RN: Carlene Coria Primary Care Patt Steinhardt: Carley Hammed Other Clinician: Referring Bricelyn Freestone: Carley Hammed Treating Renee Erb/Extender: Skipper Cliche in Treatment: 5 Vital Signs Time Taken: 13:24 Temperature (F): 97.9 Height (in): 68 Pulse (bpm): 69 Weight (lbs): 340 Respiratory Rate (breaths/min): 18 Body Mass Index (BMI): 51.7 Blood Pressure (mmHg): 165/96 Reference Range: 80 - 120 mg / dl Electronic Signature(s) Signed: 12/26/2021 10:45:13 AM By: Carlene Coria RN Entered By: Carlene Coria on 12/25/2021 13:25:18

## 2021-12-26 NOTE — Progress Notes (Signed)
QUADIR, MUNS ([id number]) Visit Report for 12/17/2021 Physician Orders Details Patient Name: [name], [name]. Date of Service: 12/17/2021 3:45 PM Medical Record Number: [id number] Patient Account Number: [id number] Date of Birth/Sex: [birth year] (71 y.o. M) Treating RN: Laurina Bustle Primary Care Provider: Ventura Sellers Other Clinician: Referring Provider: Ventura Sellers Treating Provider/Extender: Tilda Franco in Treatment: 4 Verbal / Phone Orders: No Diagnosis Coding Follow-up Appointments o Return Appointment in 1 week. Bathing/ Shower/ Hygiene o May shower with wound dressing protected with water repellent cover or cast protector. Edema Control - Lymphedema / Segmental Compressive Device / Other o Elevate, Exercise Daily and Avoid Standing for Long Periods of Time. o Elevate legs to the level of the heart and pump ankles as often as possible o Elevate leg(s) parallel to the floor when sitting. Wound Treatment Electronic Signature(s) Signed: 12/17/2021 4:20:44 PM By: Laurina Bustle Signed: 12/26/2021 11:52:51 AM By: Geralyn Corwin DO Previous Signature: 12/17/2021 3:50:23 PM Version By: Geralyn Corwin DO Entered By: Laurina Bustle on 12/17/2021 16:11:04 Arriaga, [name] ([id number]) -------------------------------------------------------------------------------- SuperBill Details Patient Name: [name] Date of Service: 12/17/2021 Medical Record Number: [id number] Patient Account Number: [id number] Date of Birth/Sex: 03/06/1951 (71 y.o. 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

## 2022-01-01 ENCOUNTER — Encounter: Payer: Medicare Other | Admitting: Physician Assistant

## 2022-01-01 DIAGNOSIS — I87331 Chronic venous hypertension (idiopathic) with ulcer and inflammation of right lower extremity: Secondary | ICD-10-CM | POA: Diagnosis not present

## 2022-01-01 NOTE — Progress Notes (Addendum)
[name], Wesley G. ([id number]) Visit Report for 01/01/2022 Chief Complaint Document Details Patient Name: [name], Wesley G. Date of Service: 01/01/2022 11:00 AM Medical Record Number: [id number] Patient Account Number: [id number] Date of Birth/Sex: [birth year](70 y.o. 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 ([id number]) -------------------------------------------------------------------------------- HPI Details Patient Name: [name], Wesley G. Date of Service: 01/01/2022 11:00 AM Medical Record Number: [id number] Patient Account Number: [id number] Date of Birth/Sex: [birth year](70 y.o. M) Treating RN: Yevonne PaxEpps, Carrie Primary Care Provider: Ventura SellersGodwin, Patrick Other Clinician: Referring Provider: Ventura SellersGodwin, Patrick Treating Provider/Extender: Rowan BlaseStone, Meloney Feld Weeks in Treatment: 6 History of Present Illness HPI Description: 11-14-2021 upon evaluation today patient presents for initial evaluation here in our clinic concerning issues he is having with his right lower extremity he does have a wound on the posterior aspect. This has been present intermittently honestly since 1993. 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 There is a lot of scar tissue which I think is probably the reason why this reopens specifically in regard to the fact that he does have chronic venous insufficiency and lymphedema in his extremities. This means anytime his legs swell this is good to be the most likely area  to weep. Right now its been about 3 weeks they have been trying to get this closed at this time and to be honest generally it closes much more quickly than that. That is the reason he has been seen today to see if we can get this closed. Has been on 3 rounds of antibiotics without benefit. On 10-01-2021 the patient did have stents placed bilaterally in the lower extremities and ablation on the left side done. This is outside of our visible EMR system I was not able to confirm this independently but his arterial flow and ABIs in the clinic today was 1.33 on the right. He also was started on Augmentin on May 15 for 10-day course. That is done at this point. Otherwise he has a history of hypertension and COPD. 11-21-2021 upon evaluation today patient appears to be doing well with regard to his wound. In fact this is showing signs of good improvement. It still slow to heal but nonetheless I think we are seeing improvements in size and overall appearance of the wound bed and general. I am very pleased in that regard. 12-04-2021 upon evaluation today patient appears to be doing well currently in regard to his wound and this is actually showing signs of significant improvement which is great news. He has been in the hospital and therefore we have not seen him for a few weeks but nonetheless in the interim his wound has dramatically gotten better which is great news. I do not see any evidence of infection right now. 12-11-2021 upon evaluation today patient appears to be doing well currently in regard to the wound on his leg. He has been tolerating the dressing changes without complication. Fortunately there does not appear to be any signs of active infection locally or systemically at this point which is great news. No fevers, chills, nausea, vomiting, or diarrhea. 12-25-2021 upon evaluation  today patient appears to be doing well currently in regard to his wound. He has been tolerating the dressing changes without  complication and this is actually showing signs of significant improvement. Fortunately there does not appear to be any signs of active infection at this time. No fevers, chills, nausea, vomiting, or diarrhea. 01-01-2022 upon evaluation today patient actually appears to be completely healed and this is great news. Fortunately I do not see any evidence of active infection locally or systemically which is excellent. Overall I am very pleased with where we stand and I think that the patient is making excellent progress here. He does have his Velcro compression wrap which I am hopeful will keep things under control here going forward. Electronic Signature(s) Signed: 01/01/2022 1:19:28 PM By: Lenda Kelp PA-C Entered By: Lenda Kelp on 01/01/2022 13:19:28 [name], [name] ([id number]) -------------------------------------------------------------------------------- Physical Exam Details Patient Name: [name] Date of Service: 01/01/2022 11:00 AM Medical Record Number: [id number] Patient Account Number: [id number] Date of Birth/Sex: 1950/08/11 (71 y.o. M) Treating RN: Yevonne Pax Primary Care Provider: Ventura Sellers Other Clinician: Referring Provider: Ventura Sellers Treating Provider/Extender: Rowan Blase in Treatment: 6 Constitutional Obese and well-hydrated in no acute distress. Respiratory normal breathing without difficulty. Psychiatric this patient is able to make decisions and demonstrates good insight into disease process. Alert and Oriented x 3. pleasant and cooperative. Notes Upon inspection patient's wound bed actually showed signs of good granulation and epithelization at this point. In fact this appears to be completely closed and I am extremely pleased with where things stand today I do not see any evidence of active infection at this time which is great news. Electronic Signature(s) Signed: 01/01/2022 1:19:47 PM By: Lenda Kelp PA-C Entered By:  Lenda Kelp on 01/01/2022 13:19:47 Szabo, [name] ([id number]) -------------------------------------------------------------------------------- Physician Orders Details Patient Name: [name] Date of Service: 01/01/2022 11:00 AM Medical Record Number: [id number] Patient Account Number: [id number] Date of Birth/Sex: Mar 29, 1951 (71 y.o. M) Treating RN: Yevonne Pax Primary Care Provider: Ventura Sellers Other Clinician: Referring Provider: Ventura Sellers Treating Provider/Extender: Rowan Blase in Treatment: 6 Verbal / Phone Orders: No Diagnosis Coding ICD-10 Coding Code Description [phone] Chronic venous hypertension (idiopathic) with ulcer and inflammation of right lower extremity I89.0 Lymphedema, not elsewhere classified L97.812 Non-pressure chronic ulcer of other part of right lower leg with fat layer exposed I10 Essential (primary) hypertension J44.9 Chronic obstructive pulmonary disease, unspecified Discharge From [facility] o Discharge from Wound Care Center Treatment Complete o Wear compression garments daily. Put garments on first thing when you wake up and remove them before bed. o Moisturize legs daily after removing compression garments. o Elevate, Exercise Daily and Avoid Standing for Long Periods of Time. Electronic Signature(s) Signed: 01/01/2022 5:07:46 PM By: Lenda Kelp PA-C Signed: 01/06/2022 3:54:37 PM By: Yevonne Pax RN Entered By: Yevonne Pax on 01/01/2022 11:44:46 Wilkerson, [name] ([id number]) -------------------------------------------------------------------------------- Problem List Details Patient Name: JAHMEIR, GEISEN Date of Service: 01/01/2022 11:00 AM Medical Record Number: [id number] Patient Account Number: [id number] Date of Birth/Sex: 1950/07/03 (71 y.o. M) Treating RN: Yevonne Pax Primary Care Provider: Ventura Sellers Other Clinician: Referring Provider: Ventura Sellers Treating  Provider/Extender: Rowan Blase in Treatment: 6 Active Problems ICD-10 Encounter Code Description Active Date MDM Diagnosis I87.331 Chronic venous hypertension (idiopathic) with ulcer and inflammation of 11/14/2021 No Yes right lower extremity I89.0 Lymphedema, not elsewhere classified 11/14/2021 No Yes L97.812 Non-pressure chronic ulcer of other part of right lower leg with  fat layer 11/14/2021 No Yes exposed I10 Essential (primary) hypertension 11/14/2021 No Yes J44.9 Chronic obstructive pulmonary disease, unspecified 11/14/2021 No Yes Inactive Problems Resolved Problems Electronic Signature(s) Signed: 01/01/2022 11:41:34 AM By: Lenda Kelp PA-C Entered By: Lenda Kelp on 01/01/2022 11:41:34 Meinhart, [name] ([id number]) -------------------------------------------------------------------------------- Progress Note Details Patient Name: [name] Date of Service: 01/01/2022 11:00 AM Medical Record Number: [id number] Patient Account Number: [id number] Date of Birth/Sex: July 15, 1950 (71 y.o. 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 This has been present intermittently honestly since 1993. 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 There is a lot of scar tissue which I think is probably the reason why this reopens specifically in regard to the fact that he does have  chronic venous insufficiency and lymphedema in his extremities. This means anytime his legs swell this is good to be the most likely area to weep. Right now its been about 3 weeks they have been trying to get this closed at this time and to be honest generally it closes much more quickly than that. That is the reason he has been seen today to see if we can get this closed. Has been on 3 rounds of antibiotics without benefit. On 10-01-2021 the patient did have stents placed bilaterally in the lower extremities and ablation on the left side done. This is outside of our visible EMR system I was not able to confirm this independently but his arterial flow and ABIs in the clinic today was 1.33 on the right. He also was started on Augmentin on May 15 for 10-day course. That is done at this point. Otherwise he has a history of hypertension and COPD. 11-21-2021 upon evaluation today patient appears to be doing well with regard to his wound. In fact this is showing signs of good improvement. It still slow to heal but nonetheless I think we are seeing improvements in size and overall appearance of the wound bed and general. I am very pleased in that regard. 12-04-2021 upon evaluation today patient appears to be doing well currently in regard to his wound and this is actually showing signs of significant improvement which is great news. He has been in the hospital and therefore we have not seen him for a few weeks but nonetheless in the interim his wound has dramatically gotten better which is great news. I do not see any evidence of infection right now. 12-11-2021 upon evaluation today patient appears to be doing well currently in regard to the wound on his leg. He has been tolerating the dressing changes without complication. Fortunately there does not appear to be any signs of active infection locally or systemically at this point which is great news. No fevers, chills, nausea, vomiting, or diarrhea. 12-25-2021  upon evaluation today patient appears to be doing well currently in regard to his wound. He has been tolerating the dressing changes without complication and this is actually showing signs of significant improvement. Fortunately there  does not appear to be any signs of active infection at this time. No fevers, chills, nausea, vomiting, or diarrhea. 01-01-2022 upon evaluation today patient actually appears to be completely healed and this is great news. Fortunately I do not see any evidence of active infection locally or systemically which is excellent. Overall I am very pleased with where we stand and I think that the patient is making excellent progress here. He does have his Velcro compression wrap which I am hopeful will keep things under control here going forward. Objective Constitutional Obese and well-hydrated in no acute distress. Vitals Time Taken: 11:29 AM, Height: 68 in, Weight: 340 lbs, BMI: 51.7, Temperature: 98 F, Pulse: 69 bpm, Respiratory Rate: 20 breaths/min, Blood Pressure: 145/90 mmHg. Respiratory normal breathing without difficulty. Psychiatric this patient is able to make decisions and demonstrates good insight into disease process. Alert and Oriented x 3. pleasant and cooperative. General Notes: Upon inspection patient's wound bed actually showed signs of good granulation and epithelization at this point. In fact this appears to be completely closed and I am extremely pleased with where things stand today I do not see any evidence of active infection at this time which ISSAC, MOURE. ([id number]) is great news. Integumentary (Hair, Skin) Wound #1 status is Healed - Epithelialized. Original cause of wound was Bite. The date acquired was: 12/15/1991. The wound has been in treatment 6 weeks. The wound is located on the Right Lower Leg. The wound measures 0cm length x 0cm width x 0cm depth; 0cm^2 area and 0cm^3 volume. There is no tunneling or undermining noted. There is a  none present amount of drainage noted. There is no granulation within the wound bed. There is no necrotic tissue within the wound bed. Assessment Active Problems ICD-10 Chronic venous hypertension (idiopathic) with ulcer and inflammation of right lower extremity Lymphedema, not elsewhere classified Non-pressure chronic ulcer of other part of right lower leg with fat layer exposed Essential (primary) hypertension Chronic obstructive pulmonary disease, unspecified Plan Discharge From [facility] Services: Discharge from Wound Care Center Treatment Complete Wear compression garments daily. Put garments on first thing when you wake up and remove them before bed. Moisturize legs daily after removing compression garments. Elevate, Exercise Daily and Avoid Standing for Long Periods of Time. 1. I would recommend that we go ahead and discontinue wound care services as the patient appears to be completely healed. 2. I I am going to also recommend the patient go ahead and initiate treatment with the Velcro compression wrap which I feel like he is doing quite well. We will see him back for follow-up visit as needed if anything changes or worsens. Electronic Signature(s) Signed: 01/01/2022 1:20:37 PM By: Lenda Kelp PA-C Entered By: Lenda Kelp on 01/01/2022 13:20:37 Heckmann, [name] ([id number]) -------------------------------------------------------------------------------- SuperBill Details Patient Name: [name] Date of Service: 01/01/2022 Medical Record Number: [id number] Patient Account Number: [id number] Date of Birth/Sex: 1951/04/15 (71 y.o. M) Treating RN: Yevonne Pax Primary Care Provider: Ventura Sellers Other Clinician: Referring Provider: Ventura Sellers Treating Provider/Extender: Rowan Blase in Treatment: 6 Diagnosis Coding ICD-10 Codes Code Description [phone] Chronic venous hypertension (idiopathic) with ulcer and inflammation of right lower  extremity I89.0 Lymphedema, not elsewhere classified L97.812 Non-pressure chronic ulcer of other part of right lower leg with fat layer exposed I10 Essential (primary) hypertension J44.9 Chronic obstructive pulmonary disease, unspecified Facility Procedures CPT4 Code: [id number] Description: 96789 - WOUND CARE VISIT-LEV 2 EST PT Modifier: Quantity: 1 Physician Procedures CPT4 Code Description: [id number]  99213 - WC PHYS LEVEL 3 - EST PT Modifier: Quantity: 1 CPT4 Code Description: ICD-10 Diagnosis Description I87.331 Chronic venous hypertension (idiopathic) with ulcer and inflammation of I89.0 Lymphedema, not elsewhere classified L97.812 Non-pressure chronic ulcer of other part of right lower leg with fat la  I10 Essential (primary) hypertension Modifier: right lower extremit yer exposed Quantity: y Psychologist, prison and probation services) Signed: 01/01/2022 1:20:47 PM By: Lenda Kelp PA-C Entered By: Lenda Kelp on 01/01/2022 13:20:47

## 2022-01-06 NOTE — Progress Notes (Signed)
Wesley, Bryant ([id number]) Visit Report for 01/01/2022 Arrival Information Details Patient Name: [name], LEVER. Date of Service: 01/01/2022 11:00 AM Medical Record Number: [id number] Patient Account Number: [id number] Date of Birth/Sex: March 09, 1951 (71 y.o. M) Treating RN: Yevonne Pax Primary Care Julieanna Geraci: Ventura Sellers Other Clinician: Referring Latosha Gaylord: Ventura Sellers Treating Peyton Spengler/Extender: Rowan Blase in Treatment: 6 Visit Information History Since Last Visit All ordered tests and consults were completed: No Patient Arrived: Ambulatory Added or deleted any medications: No Arrival Time: 11:26 Any new allergies or adverse reactions: No Accompanied By: self Had a fall or experienced change in No Transfer Assistance: None activities of daily living that may affect Patient Identification Verified: Yes risk of falls: Secondary Verification Process Completed: Yes Signs or symptoms of abuse/neglect since last visito No Patient Requires Transmission-Based Precautions: No Hospitalized since last visit: No Patient Has Alerts: No Implantable device outside of the clinic excluding No cellular tissue based products placed in the center since last visit: Has Dressing in Place as Prescribed: Yes Has Compression in Place as Prescribed: Yes Pain Present Now: No Electronic Signature(s) Signed: 01/06/2022 3:54:37 PM By: Yevonne Pax RN Entered By: Yevonne Pax on 01/01/2022 11:29:21 Wesley, Karene Bryant ([id number]) -------------------------------------------------------------------------------- Clinic Level of Care Assessment Details Patient Name: [name] Date of Service: 01/01/2022 11:00 AM Medical Record Number: [id number] Patient Account Number: [id number] Date of Birth/Sex: 12-Dec-1950 (71 y.o. M) Treating RN: Yevonne Pax Primary Care [name]: Ventura Sellers Other Clinician: Referring Laverle Pillard: Ventura Sellers Treating Sherice Ijames/Extender: Rowan Blase in Treatment: 6 Clinic Level of Care Assessment Items TOOL 4 Quantity Score X - Use when only an EandM is performed on FOLLOW-UP visit 1 0 ASSESSMENTS - Nursing Assessment / Reassessment X - Reassessment of Co-morbidities (includes updates in patient status) 1 10 X- 1 5 Reassessment of Adherence to Treatment Plan ASSESSMENTS - Wound and Skin Assessment / Reassessment X - Simple Wound Assessment / Reassessment - one wound 1 5 []  - 0 Complex Wound Assessment / Reassessment - multiple wounds []  - 0 Dermatologic / Skin Assessment (not related to wound area) ASSESSMENTS - Focused Assessment []  - Circumferential Edema Measurements - multi extremities 0 []  - 0 Nutritional Assessment / Counseling / Intervention []  - 0 Lower Extremity Assessment (monofilament, tuning fork, pulses) []  - 0 Peripheral Arterial Disease Assessment (using hand held doppler) ASSESSMENTS - Ostomy and/or Continence Assessment and Care []  - Incontinence Assessment and Management 0 []  - 0 Ostomy Care Assessment and Management (repouching, etc.) PROCESS - Coordination of Care X - Simple Patient / Family Education for ongoing care 1 15 []  - 0 Complex (extensive) Patient / Family Education for ongoing care X- 1 10 Staff obtains , Records, Test Results / Process Orders []  - 0 Staff telephones HHA, Nursing Homes / Clarify orders / etc []  - 0 Routine Transfer to another Facility (non-emergent condition) []  - 0 Routine Hospital Admission (non-emergent condition) []  - 0 New Admissions / / Ordering NPWT, Apligraf, etc. []  - 0 Emergency Hospital Admission (emergent condition) X- 1 10 Simple Discharge Coordination []  - 0 Complex (extensive) Discharge Coordination PROCESS - Special Needs []  - Pediatric / Minor Patient Management 0 []  - 0 Isolation Patient Management []  - 0 Hearing / Language / Visual special needs []  - 0 Assessment of Community assistance  (transportation, D/C planning, etc.) []  - 0 Additional assistance / Altered mentation []  - 0 Support Surface(s) Assessment (bed, cushion, seat, etc.) INTERVENTIONS - Wound Cleansing / Measurement Bryant, Wesley G. ( )  X- 1 5 Simple Wound Cleansing - one wound []  - 0 Complex Wound Cleansing - multiple wounds X- 1 5 Wound Imaging (photographs - any number of wounds) []  - 0 Wound Tracing (instead of photographs) X- 1 5 Simple Wound Measurement - one wound []  - 0 Complex Wound Measurement - multiple wounds INTERVENTIONS - Wound Dressings []  - Small Wound Dressing one or multiple wounds 0 []  - 0 Medium Wound Dressing one or multiple wounds []  - 0 Large Wound Dressing one or multiple wounds []  - 0 Application of Medications - topical []  - 0 Application of Medications - injection INTERVENTIONS - Miscellaneous []  - External ear exam 0 []  - 0 Specimen Collection (cultures, biopsies, blood, body fluids, etc.) []  - 0 Specimen(s) / Culture(s) sent or taken to Lab for analysis []  - 0 Patient Transfer (multiple staff / / Similar devices) []  - 0 Simple Staple / Suture removal (25 or less) []  - 0 Complex Staple / Suture removal (26 or more) []  - 0 Hypo / Hyperglycemic Management (close monitor of Blood Glucose) []  - 0 Ankle / Brachial Index (ABI) - do not check if billed separately X- 1 5 Vital Signs Has the patient been seen at the hospital within the last three years: Yes Total Score: 75 Level Of Care: New/Established - Level 2 Electronic Signature(s) Signed: 01/06/2022 3:54:37 PM By: RN Entered By: on 01/01/2022 11:45:14 Bassford, ( ) -------------------------------------------------------------------------------- Encounter Discharge Information Details Patient Name: Date of Service: 01/01/2022 11:00 AM Medical Record Number: Patient Account Number: Date of  Birth/Sex: 06-14-1951 (71 y.o. M) Treating RN: Primary Care [name]: Other Clinician: Referring [name]: Treating [name]/Extender: in Treatment: 6 Encounter Discharge Information Items Discharge Condition: Stable Ambulatory Status: Walker Discharge Destination: Home Transportation: Private Auto Accompanied By: self Schedule Follow-up Appointment: Yes Clinical Summary of Care: Electronic Signature(s) Signed: 01/06/2022 3:54:37 PM By: Yevonne Pax RN Entered By: Yevonne Pax on 01/01/2022 11:46:25 Mander, Karene Bryant ([id number]) -------------------------------------------------------------------------------- Lower Extremity Assessment Details Patient Name: [name] Date of Service: 01/01/2022 11:00 AM Medical Record Number: [id number] Patient Account Number: [id number] Date of Birth/Sex: 08-21-1950 (71 y.o. M) Treating RN: Yevonne Pax Primary Care Gordana Kewley: Ventura Sellers Other Clinician: Referring Vanesa Renier: Ventura Sellers Treating Rahkeem Senft/Extender: Rowan Blase in Treatment: 6 Edema Assessment Assessed: 01/08/2022: No] [Right: No] Edema: [Left: Ye] [Right: s] Calf Left: Right: Point of Measurement: 37 cm From Medial Instep 47 cm Ankle Left: Right: Point of Measurement: 13 cm From Medial Instep 32 cm Knee To Floor Left: Right: From Medial Instep 47 cm Vascular Assessment Pulses: Dorsalis Pedis Palpable: [Right:Yes] Electronic Signature(s) Signed: 01/06/2022 3:54:37 PM By: Yevonne Pax RN Entered By: 01/03/2022 on 01/01/2022 11:43:36 Oleski, [id number] ([name]) -------------------------------------------------------------------------------- Multi Wound Chart Details Patient Name: 01/03/2022 Date of Service: 01/01/2022 11:00 AM Medical Record Number: [id number] Patient Account Number: 14/04/1951 Date of Birth/Sex: September 18, 1950 (71 y.o. M) Treating RN: Ventura Sellers Primary  Care Tymara Saur: Ventura Sellers Other Clinician: Referring Guinn Delarosa: Rowan Blase Treating Maykayla Highley/Extender: Kyra Searles in Treatment: 6 Vital Signs Height(in): 68 Pulse(bpm): 69 Weight(lbs): 340 Blood Pressure(mmHg): 145/90 Body Mass Index(BMI): 51.7 Temperature(F): 98 Respiratory Rate(breaths/min): 20 Photos: [1:No Photos] [N/A:N/A] Wound Location: [1:Right Lower Leg] [N/A:N/A] Wounding Event: [1:Bite] [N/A:N/A] Primary Etiology: [1:Venous Leg Ulcer] [N/A:N/A] Comorbid History: [1:Asthma, Chronic Obstructive Pulmonary Disease (COPD), Hypertension] [N/A:N/A] Date Acquired: [1:12/15/1991] [N/A:N/A] Weeks of Treatment: [1:6] [N/A:N/A] Wound Status: [1:Healed -  Epithelialized] [N/A:N/A] Wound Recurrence: [1:No] [N/A:N/A] Measurements L x W x D (cm) [1:0x0x0] [N/A:N/A] Area (cm) : [1:0] [N/A:N/A] Volume (cm) : [1:0] [N/A:N/A] % Reduction in Area: [1:100.00%] [N/A:N/A] % Reduction in Volume: [1:100.00%] [N/A:N/A] Classification: [1:Full Thickness Without Exposed Support Structures] [N/A:N/A] Exudate Amount: [1:None Present] [N/A:N/A] Granulation Amount: [1:None Present (0%)] [N/A:N/A] Necrotic Amount: [1:None Present (0%)] [N/A:N/A] Exposed Structures: [1:Fascia: No Fat Layer (Subcutaneous Tissue): No Tendon: No Muscle: No Joint: No Bone: No Large (67-100%)] [N/A:N/A N/A] Treatment Notes Electronic Signature(s) Signed: 01/06/2022 3:54:37 PM By: Yevonne Pax RN Entered By: Yevonne Pax on 01/01/2022 11:43:57 Weinheimer, Karene Bryant ([id number]) -------------------------------------------------------------------------------- Multi-Disciplinary Care Plan Details Patient Name: [name] Date of Service: 01/01/2022 11:00 AM Medical Record Number: [id number] Patient Account Number: [id number] Date of Birth/Sex: [birth year] (71 y.o. M) Treating RN: Yevonne Pax Primary Care Elzia Hott: Ventura Sellers Other Clinician: Referring Lavance Beazer: Ventura Sellers Treating  Byron Tipping/Extender: Rowan Blase in Treatment: 6 Active Inactive Electronic Signature(s) Signed: 01/06/2022 3:54:37 PM By: Yevonne Pax RN Entered By: Yevonne Pax on 01/01/2022 11:43:49 Hach, Karene Bryant ([id number]) -------------------------------------------------------------------------------- Pain Assessment Details Patient Name: [name] Date of Service: 01/01/2022 11:00 AM Medical Record Number: [id number] Patient Account Number: [id number] Date of Birth/Sex: 01-24-1951 (71 y.o. M) Treating RN: Yevonne Pax Primary Care Montrice Montuori: Ventura Sellers Other Clinician: Referring Jarae Panas: Ventura Sellers Treating Stana Bayon/Extender: Rowan Blase in Treatment: 6 Active Problems Location of Pain Severity and Description of Pain Patient Has Paino No Site Locations Pain Management and Medication Current Pain Management: Electronic Signature(s) Signed: 01/06/2022 3:54:37 PM By: Yevonne Pax RN Entered By: Yevonne Pax on 01/01/2022 11:29:51 Tignor, Karene Bryant ([id number]) -------------------------------------------------------------------------------- Patient/Caregiver Education Details Patient Name: [name] Date of Service: 01/01/2022 11:00 AM Medical Record Number: [id number] Patient Account Number: [id number] Date of Birth/Gender: 1951/03/04 (71 y.o. M) Treating RN: Yevonne Pax Primary Care Physician: Ventura Sellers Other Clinician: Referring Physician: Ventura Sellers Treating Physician/Extender: Rowan Blase in Treatment: 6 Education Assessment Education Provided To: Patient Education Topics Provided Wound/Skin Impairment: Methods: Explain/Verbal Responses: State content correctly Electronic Signature(s) Signed: 01/06/2022 3:54:37 PM By: Yevonne Pax RN Entered By: Yevonne Pax on 01/01/2022 11:45:32 Groot, Karene Bryant ([id number]) -------------------------------------------------------------------------------- Wound Assessment  Details Patient Name: [name] Date of Service: 01/01/2022 11:00 AM Medical Record Number: [id number] Patient Account Number: [id number] Date of Birth/Sex: 08/10/1950 (71 y.o. M) Treating RN: Yevonne Pax Primary Care Jalin Erpelding: Ventura Sellers Other Clinician: Referring Tredarius Cobern: Ventura Sellers Treating Joselito Fieldhouse/Extender: Rowan Blase in Treatment: 6 Wound Status Wound Number: 1 Primary Venous Leg Ulcer Etiology: Wound Location: Right Lower Leg Wound Status: Healed - Epithelialized Wounding Event: Bite Comorbid Asthma, Chronic Obstructive Pulmonary Disease Date Acquired: 12/15/1991 History: (COPD), Hypertension Weeks Of Treatment: 6 Clustered Wound: No Wound Measurements Length: (cm) 0 Width: (cm) 0 Depth: (cm) 0 Area: (cm) 0 Volume: (cm) 0 % Reduction in Area: 100% % Reduction in Volume: 100% Epithelialization: Large (67-100%) Tunneling: No Undermining: No Wound Description Classification: Full Thickness Without Exposed Support Structure Exudate Amount: None Present s Foul Odor After Cleansing: No Slough/Fibrino No Wound Bed Granulation Amount: None Present (0%) Exposed Structure Necrotic Amount: None Present (0%) Fascia Exposed: No Fat Layer (Subcutaneous Tissue) Exposed: No Tendon Exposed: No Muscle Exposed: No Joint Exposed: No Bone Exposed: No Treatment Notes Wound #1 (Lower Leg) Wound Laterality: Right Cleanser Peri-Wound Care Topical Primary Dressing Secondary Dressing Secured With Compression Wrap Compression Stockings Add-Ons Electronic Signature(s) Signed: 01/06/2022 3:54:37 PM By: Yevonne Pax RN Entered By: Yevonne Pax on 01/01/2022 11:43:10 Clason,  Karene Bryant ([id number]) -------------------------------------------------------------------------------- Vitals Details Patient Name: RYLON, POITRA. Date of Service: 01/01/2022 11:00 AM Medical Record Number: [id number] Patient Account Number: [id number] Date of Birth/Sex:  Nov 03, 1950 (71 y.o. M) Treating RN: Yevonne Pax Primary Care Sehaj Kolden: Ventura Sellers Other Clinician: Referring Ladawn Boullion: Ventura Sellers Treating Ammanda Dobbins/Extender: Rowan Blase in Treatment: 6 Vital Signs Time Taken: 11:29 Temperature (F): 98 Height (in): 68 Pulse (bpm): 69 Weight (lbs): 340 Respiratory Rate (breaths/min): 20 Body Mass Index (BMI): 51.7 Blood Pressure (mmHg): 145/90 Reference Range: 80 - 120 mg / dl Electronic Signature(s) Signed: 01/06/2022 3:54:37 PM By: Yevonne Pax RN Entered By: Yevonne Pax on 01/01/2022 11:29:40

## 2022-01-08 ENCOUNTER — Ambulatory Visit: Payer: Medicare Other | Admitting: Internal Medicine

## 2023-09-02 ENCOUNTER — Ambulatory Visit: Payer: Medicare Other | Admitting: Physician Assistant

## 2023-09-03 ENCOUNTER — Encounter: Payer: Medicare Other | Attending: Physician Assistant | Admitting: Physician Assistant

## 2023-09-03 DIAGNOSIS — I10 Essential (primary) hypertension: Secondary | ICD-10-CM | POA: Diagnosis not present

## 2023-09-03 DIAGNOSIS — J4489 Other specified chronic obstructive pulmonary disease: Secondary | ICD-10-CM | POA: Diagnosis not present

## 2023-09-03 DIAGNOSIS — L97812 Non-pressure chronic ulcer of other part of right lower leg with fat layer exposed: Secondary | ICD-10-CM | POA: Diagnosis not present

## 2023-09-03 DIAGNOSIS — I890 Lymphedema, not elsewhere classified: Secondary | ICD-10-CM | POA: Insufficient documentation

## 2023-09-03 DIAGNOSIS — I87331 Chronic venous hypertension (idiopathic) with ulcer and inflammation of right lower extremity: Secondary | ICD-10-CM | POA: Insufficient documentation

## 2023-09-08 ENCOUNTER — Encounter: Admitting: Internal Medicine

## 2023-09-08 DIAGNOSIS — I87331 Chronic venous hypertension (idiopathic) with ulcer and inflammation of right lower extremity: Secondary | ICD-10-CM | POA: Diagnosis not present

## 2023-09-13 ENCOUNTER — Encounter: Admitting: Internal Medicine

## 2023-09-13 DIAGNOSIS — I87331 Chronic venous hypertension (idiopathic) with ulcer and inflammation of right lower extremity: Secondary | ICD-10-CM | POA: Diagnosis not present

## 2023-09-20 ENCOUNTER — Encounter: Attending: Physician Assistant | Admitting: Physician Assistant

## 2023-09-20 DIAGNOSIS — I10 Essential (primary) hypertension: Secondary | ICD-10-CM | POA: Diagnosis not present

## 2023-09-20 DIAGNOSIS — I87331 Chronic venous hypertension (idiopathic) with ulcer and inflammation of right lower extremity: Secondary | ICD-10-CM | POA: Diagnosis present

## 2023-09-20 DIAGNOSIS — J449 Chronic obstructive pulmonary disease, unspecified: Secondary | ICD-10-CM | POA: Diagnosis not present

## 2023-09-20 DIAGNOSIS — I890 Lymphedema, not elsewhere classified: Secondary | ICD-10-CM | POA: Insufficient documentation

## 2023-09-20 DIAGNOSIS — L97812 Non-pressure chronic ulcer of other part of right lower leg with fat layer exposed: Secondary | ICD-10-CM | POA: Diagnosis not present

## 2023-09-27 ENCOUNTER — Encounter: Admitting: Physician Assistant

## 2023-09-28 ENCOUNTER — Encounter: Admitting: Physician Assistant

## 2023-09-28 DIAGNOSIS — I87331 Chronic venous hypertension (idiopathic) with ulcer and inflammation of right lower extremity: Secondary | ICD-10-CM | POA: Diagnosis not present

## 2023-10-04 ENCOUNTER — Encounter: Admitting: Physician Assistant

## 2023-10-04 DIAGNOSIS — I87331 Chronic venous hypertension (idiopathic) with ulcer and inflammation of right lower extremity: Secondary | ICD-10-CM | POA: Diagnosis not present

## 2023-10-11 ENCOUNTER — Encounter: Admitting: Physician Assistant

## 2023-10-11 DIAGNOSIS — I87331 Chronic venous hypertension (idiopathic) with ulcer and inflammation of right lower extremity: Secondary | ICD-10-CM | POA: Diagnosis not present

## 2023-10-18 ENCOUNTER — Encounter: Attending: Physician Assistant | Admitting: Physician Assistant

## 2023-10-18 DIAGNOSIS — I87331 Chronic venous hypertension (idiopathic) with ulcer and inflammation of right lower extremity: Secondary | ICD-10-CM | POA: Diagnosis present

## 2023-10-18 DIAGNOSIS — L97812 Non-pressure chronic ulcer of other part of right lower leg with fat layer exposed: Secondary | ICD-10-CM | POA: Insufficient documentation

## 2023-10-18 DIAGNOSIS — I890 Lymphedema, not elsewhere classified: Secondary | ICD-10-CM | POA: Diagnosis not present

## 2023-10-18 DIAGNOSIS — I10 Essential (primary) hypertension: Secondary | ICD-10-CM | POA: Insufficient documentation

## 2023-10-18 DIAGNOSIS — J4489 Other specified chronic obstructive pulmonary disease: Secondary | ICD-10-CM | POA: Insufficient documentation

## 2023-10-26 ENCOUNTER — Encounter: Admitting: Physician Assistant

## 2023-10-26 DIAGNOSIS — I87331 Chronic venous hypertension (idiopathic) with ulcer and inflammation of right lower extremity: Secondary | ICD-10-CM | POA: Diagnosis not present

## 2023-11-01 ENCOUNTER — Encounter: Admitting: Physician Assistant

## 2023-11-01 DIAGNOSIS — I87331 Chronic venous hypertension (idiopathic) with ulcer and inflammation of right lower extremity: Secondary | ICD-10-CM | POA: Diagnosis not present

## 2023-11-04 ENCOUNTER — Ambulatory Visit: Admitting: Physician Assistant

## 2023-11-09 ENCOUNTER — Ambulatory Visit: Admitting: Physician Assistant
